# Patient Record
Sex: Female | Born: 1980 | Marital: Married | Smoking: Never smoker
Health system: Southern US, Community
[De-identification: ages and names within clinical notes are randomized; demographics above are authoritative.]

## PROBLEM LIST (undated history)

## (undated) DIAGNOSIS — F32A Depression, unspecified: Secondary | ICD-10-CM

## (undated) DIAGNOSIS — G4720 Circadian rhythm sleep disorder, unspecified type: Secondary | ICD-10-CM

## (undated) DIAGNOSIS — E079 Disorder of thyroid, unspecified: Secondary | ICD-10-CM

## (undated) DIAGNOSIS — F419 Anxiety disorder, unspecified: Secondary | ICD-10-CM

## (undated) DIAGNOSIS — E049 Nontoxic goiter, unspecified: Secondary | ICD-10-CM

## (undated) DIAGNOSIS — F329 Major depressive disorder, single episode, unspecified: Secondary | ICD-10-CM

## (undated) DIAGNOSIS — R87629 Unspecified abnormal cytological findings in specimens from vagina: Secondary | ICD-10-CM

## (undated) DIAGNOSIS — Z8639 Personal history of other endocrine, nutritional and metabolic disease: Secondary | ICD-10-CM

## (undated) DIAGNOSIS — N200 Calculus of kidney: Secondary | ICD-10-CM

## (undated) HISTORY — DX: Disorder of thyroid, unspecified: E07.9

## (undated) HISTORY — DX: Anxiety disorder, unspecified: F41.9

## (undated) HISTORY — DX: Nontoxic goiter, unspecified: E04.9

## (undated) HISTORY — DX: Depression, unspecified: F32.A

## (undated) HISTORY — PX: BREAST SURGERY: SHX581

## (undated) HISTORY — DX: Unspecified abnormal cytological findings in specimens from vagina: R87.629

## (undated) HISTORY — DX: Circadian rhythm sleep disorder, unspecified type: G47.20

## (undated) HISTORY — DX: Calculus of kidney: N20.0

## (undated) HISTORY — DX: Personal history of other endocrine, nutritional and metabolic disease: Z86.39

## (undated) HISTORY — DX: Major depressive disorder, single episode, unspecified: F32.9

---

## 2005-07-30 ENCOUNTER — Observation Stay: Payer: Self-pay

## 2005-08-03 ENCOUNTER — Inpatient Hospital Stay: Payer: Self-pay | Admitting: Obstetrics and Gynecology

## 2009-01-26 ENCOUNTER — Ambulatory Visit: Payer: Self-pay

## 2009-05-22 ENCOUNTER — Encounter: Payer: Self-pay | Admitting: Obstetrics and Gynecology

## 2009-11-09 ENCOUNTER — Inpatient Hospital Stay: Payer: Self-pay

## 2010-02-24 ENCOUNTER — Emergency Department: Payer: Self-pay | Admitting: Internal Medicine

## 2011-04-16 ENCOUNTER — Ambulatory Visit: Payer: Self-pay | Admitting: Physician Assistant

## 2011-04-18 ENCOUNTER — Ambulatory Visit: Payer: Self-pay | Admitting: Physician Assistant

## 2011-06-05 ENCOUNTER — Ambulatory Visit: Payer: Self-pay | Admitting: Urology

## 2011-08-20 HISTORY — PX: AUGMENTATION MAMMAPLASTY: SUR837

## 2013-04-08 LAB — HM PAP SMEAR: HM PAP: NEGATIVE

## 2013-06-15 ENCOUNTER — Encounter: Payer: Self-pay | Admitting: Podiatry

## 2013-06-15 ENCOUNTER — Ambulatory Visit (INDEPENDENT_AMBULATORY_CARE_PROVIDER_SITE_OTHER): Payer: BC Managed Care – PPO | Admitting: Podiatry

## 2013-06-15 ENCOUNTER — Ambulatory Visit (INDEPENDENT_AMBULATORY_CARE_PROVIDER_SITE_OTHER): Payer: BC Managed Care – PPO

## 2013-06-15 VITALS — BP 102/68 | HR 82 | Resp 16 | Ht 63.0 in | Wt 115.0 lb

## 2013-06-15 DIAGNOSIS — M2010 Hallux valgus (acquired), unspecified foot: Secondary | ICD-10-CM

## 2013-06-15 DIAGNOSIS — M79672 Pain in left foot: Secondary | ICD-10-CM

## 2013-06-15 DIAGNOSIS — M79609 Pain in unspecified limb: Secondary | ICD-10-CM

## 2013-06-15 DIAGNOSIS — Q66229 Congenital metatarsus adductus, unspecified foot: Secondary | ICD-10-CM

## 2013-06-15 NOTE — Patient Instructions (Signed)
Call if you decide to move your surgery up to earlier date

## 2013-06-15 NOTE — Progress Notes (Signed)
Subjective:     Patient ID: Kimberly Romero, female   DOB: 1980-11-20, 32 y.o.   MRN: [id number]  HPI patient states I have bunions for a long time in my left 1 has become increasingly tender over the last couple years and especially the last 6 months have a significant family history with my mother and and having had surgery in the past   Review of Systems  All other systems reviewed and are negative.       Objective:   Physical Exam  Nursing note and vitals reviewed. Constitutional: She is oriented to person, place, and time. She appears well-developed and well-nourished.  Musculoskeletal: Normal range of motion.  Neurological: She is oriented to person, place, and time.  Skin: Skin is warm.   neurological sensation intact muscle strength adequate and mild equinus condition noted bilateral. Large hyperostosis medial aspect first metatarsal head left over right with redness and pain when pressed. 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 I discussed different treatment options and patient has decided on surgery for the left foot I have recommended [facility] bunionectomy explaining that because of metatarsus adductus we may not get complete correction but I do think it will give her a good clinical result. Patient will be seen back for consult prior to surgery tentative surgery in January

## 2013-06-15 NOTE — Progress Notes (Signed)
N HURT/TENDER L B/L BUNIONS LEFT IS WORSE D 66M O SLOWLY C WORSE A SHOES T 0

## 2013-07-23 ENCOUNTER — Encounter: Payer: Self-pay | Admitting: Podiatry

## 2013-07-23 ENCOUNTER — Ambulatory Visit (INDEPENDENT_AMBULATORY_CARE_PROVIDER_SITE_OTHER): Payer: No Typology Code available for payment source | Admitting: Podiatry

## 2013-07-23 VITALS — BP 116/69 | HR 76 | Resp 16 | Ht 63.0 in | Wt 115.0 lb

## 2013-07-23 DIAGNOSIS — M2010 Hallux valgus (acquired), unspecified foot: Secondary | ICD-10-CM

## 2013-07-23 NOTE — Patient Instructions (Signed)
Follow preop instructions and do not eat midnight night before surgery

## 2013-07-24 NOTE — Progress Notes (Signed)
Subjective:     Patient ID: Kimberly Romero, female   DOB: [birth year], 32 y.o.   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 She wants surgery and I have recommended [place] bunionectomy explaining we may not be able to get full correction that I do think it will put it into a good functioning clinical position. I allowed her to read a consent form explaining all possible complications that can occur and the fact that total recovery will be 6 months to one year. Patient signs consent form and is dispense air fracture walker with all instructions on usage which she will bring with her to surgery. Scheduled December 30 for outpatient [facility]

## 2013-07-27 ENCOUNTER — Telehealth: Payer: Self-pay | Admitting: *Deleted

## 2013-07-27 NOTE — Telephone Encounter (Signed)
PT CALLED STATED SHE CALLED HER INS CO RE: TERMINATION ON 12.31.14. SAID SHE HAS GOTTEN EVERYTHING STRAIGHT AND INS WILL NOT BE TERMINATED.

## 2013-07-27 NOTE — Telephone Encounter (Signed)
CALLED AND SPOKE WITH PT LETTING HER KNOW [employer] WAS BEING TERMINATED ON 12.31.14. PT SAID SHE WOULD CALL HER INS CO TO FIND OUT WHY. TOLD PT HER SURGERY WOULD BE COVERED [facility] ON 12.30.14 BUT X-RAYS, ETC WILL NOT BE IF NO INS COVERAGE.

## 2013-08-17 DIAGNOSIS — M2010 Hallux valgus (acquired), unspecified foot: Secondary | ICD-10-CM

## 2013-08-18 ENCOUNTER — Telehealth: Payer: Self-pay | Admitting: *Deleted

## 2013-08-18 NOTE — Telephone Encounter (Signed)
Called and spoke with pt regarding surgery. Said she was doing fine and is taking 2 pain pills every 4hrs. Told her to elevate above heart and stay off of it and apply ice and take rx as directed.

## 2013-08-24 ENCOUNTER — Encounter: Payer: Self-pay | Admitting: Podiatry

## 2013-08-24 ENCOUNTER — Ambulatory Visit (INDEPENDENT_AMBULATORY_CARE_PROVIDER_SITE_OTHER): Payer: No Typology Code available for payment source

## 2013-08-24 ENCOUNTER — Ambulatory Visit (INDEPENDENT_AMBULATORY_CARE_PROVIDER_SITE_OTHER): Payer: No Typology Code available for payment source | Admitting: Podiatry

## 2013-08-24 VITALS — BP 120/72 | HR 72 | Resp 16

## 2013-08-24 DIAGNOSIS — Z9889 Other specified postprocedural states: Secondary | ICD-10-CM

## 2013-08-24 DIAGNOSIS — M2010 Hallux valgus (acquired), unspecified foot: Secondary | ICD-10-CM

## 2013-08-24 NOTE — Progress Notes (Signed)
   Subjective:    Patient ID: Kimberly LangoBrandy Kronbergs, female    DOB: November 04, 1980, 33 y.o.   MRN: [id number]  HPI Comments: Routine post op , surgical check #1  Left foot aus bun repair, pt states that it is doing good, has not put any pressure down on it without the boot.     Review of Systems     Objective:   Physical Exam        Assessment & Plan:

## 2013-08-24 NOTE — Progress Notes (Signed)
Subjective:     Patient ID: Kimberly Romero, female   DOB: 03-Mar-1981, 33 y.o.   MRN: [id number]  HPI patient states that she is doing very well post surgery of the left foot 6 days   Review of Systems     Objective:   Physical Exam Neurovascular status intact with incision site healing very well and excellent range of motion of the first MPJ with no crepitus in the joint. Mild edema with no erythema or drainage noted    Assessment:     Healing well post bunionectomy left    Plan:     X-ray reviewed and sterile dressing reapplied with instructions on range of motion exercises and gradual surgical shoe usage. Reappoint 3 weeks and less needed to be seen earlier

## 2013-09-08 ENCOUNTER — Encounter: Payer: Self-pay | Admitting: Podiatry

## 2013-09-14 ENCOUNTER — Encounter: Payer: BC Managed Care – PPO | Admitting: Podiatry

## 2013-09-15 ENCOUNTER — Ambulatory Visit (INDEPENDENT_AMBULATORY_CARE_PROVIDER_SITE_OTHER): Payer: No Typology Code available for payment source | Admitting: Podiatry

## 2013-09-15 ENCOUNTER — Encounter: Payer: Self-pay | Admitting: Podiatry

## 2013-09-15 ENCOUNTER — Ambulatory Visit (INDEPENDENT_AMBULATORY_CARE_PROVIDER_SITE_OTHER): Payer: No Typology Code available for payment source

## 2013-09-15 VITALS — BP 126/83 | HR 95 | Resp 16 | Ht 63.0 in | Wt 115.0 lb

## 2013-09-15 DIAGNOSIS — Z9889 Other specified postprocedural states: Secondary | ICD-10-CM

## 2013-09-15 NOTE — Progress Notes (Signed)
Kimberly Romero presents today one month status post [facility] bunion repair left. She states that her foot feels so much better than it did prior to surgery. She denies fever chills nausea vomiting muscle aches and pains.  Objective: Vital signs are stable she is alert and oriented x3. Pulses are strongly palpable. She has good range of motion of the first metatarsophalangeal joint. Radiographs confirm 2 K wires first metatarsal good alignment of the joint.  Assessment: Well-healing surgical foot status post 1 month.  Plan: I would allow her to get back to some of her regular routine. Dr. Charlsie Merlesegal will followup with her in one month

## 2013-10-15 ENCOUNTER — Encounter: Payer: No Typology Code available for payment source | Admitting: Podiatry

## 2013-10-19 ENCOUNTER — Ambulatory Visit (INDEPENDENT_AMBULATORY_CARE_PROVIDER_SITE_OTHER): Payer: No Typology Code available for payment source

## 2013-10-19 ENCOUNTER — Encounter: Payer: Self-pay | Admitting: Podiatry

## 2013-10-19 ENCOUNTER — Ambulatory Visit (INDEPENDENT_AMBULATORY_CARE_PROVIDER_SITE_OTHER): Payer: No Typology Code available for payment source | Admitting: Podiatry

## 2013-10-19 VITALS — BP 106/67 | HR 72 | Resp 12

## 2013-10-19 DIAGNOSIS — Z9889 Other specified postprocedural states: Secondary | ICD-10-CM

## 2013-10-19 DIAGNOSIS — M2010 Hallux valgus (acquired), unspecified foot: Secondary | ICD-10-CM

## 2013-10-19 DIAGNOSIS — R609 Edema, unspecified: Secondary | ICD-10-CM

## 2013-10-19 NOTE — Progress Notes (Signed)
Subjective:     Patient ID: Kimberly LangoBrandy Kronbergs, female   DOB: Apr 25, 1981, 33 y.o.   MRN: [id number]  HPI patient states that she is doing well with her left foot and is able to walk and work without much trouble with occasional swelling   Review of Systems     Objective:   Physical Exam Neurovascular status intact with well-healing surgical site first metatarsal left with good range of motion an approximate 30 of dorsi flexion 25 of plantar flexion with no pain or crepitus noted    Assessment:     Doing well 8 weeks post foot surgery left    Plan:     Instructed on continued physical therapy and compression and elevation as needed. Reappoint 8 weeks or earlier if any issues should occur and x-ray reviewed today

## 2013-12-21 ENCOUNTER — Encounter: Payer: No Typology Code available for payment source | Admitting: Podiatry

## 2015-02-17 ENCOUNTER — Telehealth: Payer: Self-pay | Admitting: Obstetrics and Gynecology

## 2015-02-17 NOTE — Telephone Encounter (Signed)
PT IS GOING O VACATIONS AND WANTED TO SEE IF YOU COULD CALL IN A REFILL FOR HER XANEX AND SLEEPING PILL.

## 2015-02-21 ENCOUNTER — Telehealth: Payer: Self-pay | Admitting: Obstetrics and Gynecology

## 2015-02-21 ENCOUNTER — Other Ambulatory Visit: Payer: Self-pay | Admitting: Obstetrics and Gynecology

## 2015-02-21 MED ORDER — ALPRAZOLAM 0.5 MG PO TABS: 0.5000 mg | ORAL_TABLET | Freq: Every evening | ORAL | Status: DC | PRN

## 2015-02-21 MED ORDER — ZOLPIDEM TARTRATE 5 MG PO TABS: 5.0000 mg | ORAL_TABLET | Freq: Every evening | ORAL | Status: DC | PRN

## 2015-02-21 NOTE — Telephone Encounter (Signed)
Pt caleld last weeka nd was wanting refills on her medication, she is going out of town

## 2015-02-21 NOTE — Telephone Encounter (Signed)
Left detailed message for pt notifying pt rx was printed and on [facility] desk, will have her sign and fax to pts pharmacy  In the am

## 2015-02-21 NOTE — Telephone Encounter (Signed)
Please let her know they were done today

## 2015-02-23 ENCOUNTER — Telehealth: Payer: Self-pay | Admitting: Obstetrics and Gynecology

## 2015-02-23 ENCOUNTER — Other Ambulatory Visit: Payer: Self-pay | Admitting: Obstetrics and Gynecology

## 2015-02-23 MED ORDER — TRIAZOLAM 0.25 MG PO TABS: 0.2500 mg | ORAL_TABLET | Freq: Every evening | ORAL | Status: DC | PRN

## 2015-02-23 NOTE — Telephone Encounter (Signed)
Please let her know printed rx

## 2015-02-23 NOTE — Telephone Encounter (Signed)
Kimberly Romero WAS CALLED IN AND SHE TAKE TRIAZOLAM GENERIC FOR HALCION. THANKS

## 2015-02-23 NOTE — Telephone Encounter (Signed)
Done will fax to pts pharmacy

## 2015-02-23 NOTE — Telephone Encounter (Signed)
PT AWARE  

## 2015-03-07 ENCOUNTER — Ambulatory Visit: Payer: Self-pay | Admitting: Obstetrics and Gynecology

## 2015-03-28 ENCOUNTER — Encounter: Payer: Self-pay | Admitting: *Deleted

## 2015-04-04 ENCOUNTER — Ambulatory Visit (INDEPENDENT_AMBULATORY_CARE_PROVIDER_SITE_OTHER): Payer: 59 | Admitting: Obstetrics and Gynecology

## 2015-04-04 ENCOUNTER — Encounter: Payer: Self-pay | Admitting: Obstetrics and Gynecology

## 2015-04-04 VITALS — BP 112/74 | HR 78 | Ht 63.0 in | Wt 117.5 lb

## 2015-04-04 DIAGNOSIS — Z30433 Encounter for removal and reinsertion of intrauterine contraceptive device: Secondary | ICD-10-CM

## 2015-04-04 NOTE — Progress Notes (Signed)
Patient ID: Kimberly Romero, female   DOB: 12/09/1980, 34 y.o.   MRN: [id number]   Kimberly Romero is a 34 y.o. year old G81P0 Caucasian female who presents for removal and replacement of a Mirena IUD. She was given informed consent for removal and reinsertion of her Mirena. Her Mirena was placed 2011, No LMP recorded. Patient is not currently having periods (Reason: IUD)., and her pregnancy test today was NA.   The risks and benefits of the method and placement have been thouroughly reviewed with the patient and all questions were answered.  Specifically the patient is aware of failure rate of 08/998, expulsion of the IUD and of possible perforation.  The patient is aware of irregular bleeding due to the method and understands the incidence of irregular bleeding diminishes with time.  Signed copy of informed consent in chart.   No LMP recorded. Patient is not currently having periods (Reason: IUD). BP 112/74 mmHg  Pulse 78  Ht  (1.6 m)  Wt 117 lb 8 oz (53.298 kg)  BMI 20.82 kg/m2 No results found for this or any previous visit (from the past 24 hour(s)).   Appropriate time out taken. A graves speculum was placed in the vagina.  The cervix was visualized, prepped using Betadine. The strings were visible. They were grasped and the Mirena was easily removed. The cervix was then grasped with a single-tooth tenaculum. The uterus was found to be anteroflexed and it sounded to 9 cm.  Mirena IUD placed per manufacturer's recommendations without complications. The strings were trimmed to 3 cm.  The patient tolerated the procedure well.   The patient was given post procedure instructions, including signs and symptoms of infection and to check for the strings after each menses or each month, and refraining from intercourse or anything in the vagina for 3 days.  She was given a Mirena care card with date Mirena placed, and date Mirena to be removed.    Melody Elissa Lovett, CNM

## 2015-04-18 ENCOUNTER — Encounter: Payer: Self-pay | Admitting: Obstetrics and Gynecology

## 2015-05-11 ENCOUNTER — Other Ambulatory Visit: Payer: Self-pay | Admitting: Obstetrics and Gynecology

## 2015-05-11 ENCOUNTER — Encounter: Payer: Self-pay | Admitting: Obstetrics and Gynecology

## 2015-05-11 ENCOUNTER — Ambulatory Visit (INDEPENDENT_AMBULATORY_CARE_PROVIDER_SITE_OTHER): Payer: 59 | Admitting: Obstetrics and Gynecology

## 2015-05-11 DIAGNOSIS — N921 Excessive and frequent menstruation with irregular cycle: Secondary | ICD-10-CM | POA: Diagnosis not present

## 2015-05-11 DIAGNOSIS — Z975 Presence of (intrauterine) contraceptive device: Secondary | ICD-10-CM

## 2015-05-11 DIAGNOSIS — F419 Anxiety disorder, unspecified: Secondary | ICD-10-CM

## 2015-05-11 DIAGNOSIS — Z01419 Encounter for gynecological examination (general) (routine) without abnormal findings: Secondary | ICD-10-CM | POA: Diagnosis not present

## 2015-05-11 MED ORDER — ALPRAZOLAM 0.5 MG PO TABS: 0.5000 mg | ORAL_TABLET | Freq: Every evening | ORAL | Status: DC | PRN

## 2015-05-11 MED ORDER — TRIAZOLAM 0.25 MG PO TABS: 0.2500 mg | ORAL_TABLET | Freq: Every evening | ORAL | Status: DC | PRN

## 2015-05-11 MED ORDER — DESVENLAFAXINE SUCCINATE ER 50 MG PO TB24: 50.0000 mg | ORAL_TABLET | Freq: Every day | ORAL | Status: DC

## 2015-05-11 NOTE — Patient Instructions (Addendum)
Place annual gynecologic exam patient instructions here.  Thank you for enrolling in MyChart. Please follow the instructions below to securely access your online medical record. MyChart allows you to send messages to your doctor, view your test results, manage appointments, and more.   How Do I Sign Up? 1. In your Internet browser, go to [employer] and enter [email or web address]. 2. Click on the Sign Up Now link in the Sign In box. You will see the New Member Sign Up page. 3. Enter your MyChart Access Code exactly as it appears below. You will not need to use this code after you've completed the sign-up process. If you do not sign up before the expiration date, you must request a new code.  MyChart Access Code: [phone] Expires: 07/10/2015 10:31 AM  4. Enter your Social Security Number ([id number]) and Date of Birth (mm/dd/yyyy) as indicated and click Submit. You will be taken to the next sign-up page. 5. Create a MyChart ID. This will be your MyChart login ID and cannot be changed, so think of one that is secure and easy to remember. 6. Create a MyChart password. You can change your password at any time. 7. Enter your Password Reset Question and Answer. This can be used at a later time if you forget your password.  8. Enter your e-mail address. You will receive e-mail notification when new information is available in MyChart. 9. Click Sign Up. You can now view your medical record.   Additional Information Remember, MyChart is NOT to be used for urgent needs. For medical emergencies, dial 911.   Generalized Anxiety Disorder Generalized anxiety disorder (GAD) is a mental disorder. It interferes with life functions, including relationships, work, and school. GAD is different from normal anxiety, which everyone experiences at some point in their lives in response to specific life events and activities. Normal anxiety actually helps [place] prepare for and get through these  life events and activities. Normal anxiety goes away after the event or activity is over.  GAD causes anxiety that is not necessarily related to specific events or activities. It also causes excess anxiety in proportion to specific events or activities. The anxiety associated with GAD is also difficult to control. GAD can vary from mild to severe. People with severe GAD can have intense waves of anxiety with physical symptoms (panic attacks).  SYMPTOMS The anxiety and worry associated with GAD are difficult to control. This anxiety and worry are related to many life events and activities and also occur more days than not for 6 months or longer. People with GAD also have three or more of the following symptoms (one or more in children):  Restlessness.   Fatigue.  Difficulty concentrating.   Irritability.  Muscle tension.  Difficulty sleeping or unsatisfying sleep. DIAGNOSIS GAD is diagnosed through an assessment by your health care provider. Your health care provider will ask you questions aboutyour mood,physical symptoms, and events in your life. Your health care provider may ask you about your medical history and use of alcohol or drugs, including prescription medicines. Your health care provider may also do a physical exam and blood tests. Certain medical conditions and the use of certain substances can cause symptoms similar to those associated with GAD. Your health care provider may refer you to a mental health specialist for further evaluation. TREATMENT The following therapies are usually used to treat GAD:   Medication. Antidepressant medication usually is prescribed for long-term daily control. Antianxiety medicines may be added in severe cases,  especially when panic attacks occur.   Talk therapy (psychotherapy). Certain types of talk therapy can be helpful in treating GAD by providing support, education, and guidance. A form of talk therapy called cognitive behavioral therapy can  teach you healthy ways to think about and react to daily life events and activities.  Stress managementtechniques. These include yoga, meditation, and exercise and can be very helpful when they are practiced regularly. A mental health specialist can help determine which treatment is best for you. Some people see improvement with one therapy. However, other people require a combination of therapies. Document Released: 11/30/2012 Document Revised: 12/20/2013 Document Reviewed: 11/30/2012 [facility] Patient Information 2015 [place], [place]. This information is not intended to replace advice given to you by your health care provider. Make sure you discuss any questions you have with your health care provider.

## 2015-05-11 NOTE — Progress Notes (Signed)
  Subjective:     Kimberly Romero is a 34 y.o. female and is here for a comprehensive physical exam. The patient reports no problems.  Social History   Social History  . Marital Status: Married    Spouse Name: N/A  . Number of Children: N/A  . Years of Education: N/A   Occupational History  . Not on file.   Social History Main Topics  . Smoking status: Never Smoker   . Smokeless tobacco: Never Used  . Alcohol Use: Yes     Comment: OCCASIONALLY  . Drug Use: No  . Sexual Activity: Yes   Other Topics Concern  . Not on file   Social History Narrative   Health Maintenance  Topic Date Due  . HIV Screening  02/28/1994  . TETANUS/TDAP  02/28/1998  . INFLUENZA VACCINE  03/20/2015  . PAP SMEAR  04/08/2016    The following portions of the patient's history were reviewed and updated as appropriate: allergies, current medications, past family history, past medical history, past social history, past surgical history and problem list.  Review of Systems A comprehensive review of systems was negative.   Objective:    General appearance: alert, cooperative and appears stated age Neck: no adenopathy, no carotid bruit, no JVD, supple, symmetrical, trachea midline and thyroid not enlarged, symmetric, no tenderness/mass/nodules Lungs: clear to auscultation bilaterally Breasts: normal appearance, no masses or tenderness, bilateral implants without defect Heart: regular rate and rhythm, S1, S2 normal, no murmur, click, rub or gallop Abdomen: soft, non-tender; bowel sounds normal; no masses,  no organomegaly Pelvic: cervix normal in appearance, external genitalia normal, no adnexal masses or tenderness, no cervical motion tenderness, rectovaginal septum normal, uterus normal size, shape, and consistency, vagina normal without discharge and IUD string noted, dark brown scant discharge    Assessment:    Healthy female exam. Thyroid disorder; IUD survellience, anxiety with sleep  disturbance      Plan:  Pap and routine labs; wants to restart Pristiq at 50 mg, and continue current meds; RTC 1 year or as needed   See After Visit Summary for Counseling Recommendations

## 2015-05-12 LAB — COMPREHENSIVE METABOLIC PANEL WITH GFR
ALT: 11 IU/L (ref 0–32)
AST: 14 IU/L (ref 0–40)
Albumin/Globulin Ratio: 2 (ref 1.1–2.5)
Albumin: 4.9 g/dL (ref 3.5–5.5)
Alkaline Phosphatase: 41 IU/L (ref 39–117)
BUN/Creatinine Ratio: 10 (ref 8–20)
BUN: 11 mg/dL (ref 6–20)
Bilirubin Total: 0.5 mg/dL (ref 0.0–1.2)
CO2: 25 mmol/L (ref 18–29)
Calcium: 9.5 mg/dL (ref 8.7–10.2)
Chloride: 98 mmol/L (ref 97–108)
Creatinine, Ser: 1.05 mg/dL — ABNORMAL HIGH (ref 0.57–1.00)
GFR calc Af Amer: 79 mL/min/1.73
GFR calc non Af Amer: 68 mL/min/1.73
Globulin, Total: 2.5 g/dL (ref 1.5–4.5)
Glucose: 76 mg/dL (ref 65–99)
Potassium: 4 mmol/L (ref 3.5–5.2)
Sodium: 139 mmol/L (ref 134–144)
Total Protein: 7.4 g/dL (ref 6.0–8.5)

## 2015-05-12 LAB — THYROID PANEL WITH TSH
Free Thyroxine Index: 3.2 (ref 1.2–4.9)
T3 Uptake Ratio: 30 % (ref 24–39)
T4 TOTAL: 10.7 ug/dL (ref 4.5–12.0)
TSH: 0.627 u[IU]/mL (ref 0.450–4.500)

## 2015-05-12 LAB — LIPID PANEL
Chol/HDL Ratio: 2.1 ratio (ref 0.0–4.4)
Cholesterol, Total: 183 mg/dL (ref 100–199)
HDL: 86 mg/dL
LDL Calculated: 83 mg/dL (ref 0–99)
Triglycerides: 69 mg/dL (ref 0–149)
VLDL Cholesterol Cal: 14 mg/dL (ref 5–40)

## 2015-05-15 LAB — CYTOLOGY - PAP

## 2015-05-16 ENCOUNTER — Encounter: Payer: Self-pay | Admitting: *Deleted

## 2015-08-02 ENCOUNTER — Other Ambulatory Visit: Payer: Self-pay | Admitting: Obstetrics and Gynecology

## 2015-08-02 ENCOUNTER — Telehealth: Payer: Self-pay | Admitting: Obstetrics and Gynecology

## 2015-08-02 MED ORDER — ALPRAZOLAM 0.5 MG PO TABS: 0.5000 mg | ORAL_TABLET | Freq: Every evening | ORAL | Status: DC | PRN

## 2015-08-02 NOTE — Telephone Encounter (Signed)
Patient called requesting a refill on Xanax. Patient is aware that she will need to pick it up.Thanks

## 2015-08-02 NOTE — Telephone Encounter (Signed)
Notified pt rx was faxed

## 2015-08-02 NOTE — Telephone Encounter (Signed)
Refill done, have her set it up.

## 2015-08-02 NOTE — Telephone Encounter (Signed)
pls advise

## 2015-08-30 ENCOUNTER — Telehealth: Payer: Self-pay | Admitting: Obstetrics and Gynecology

## 2015-08-30 NOTE — Telephone Encounter (Signed)
Ins related about medication and authorization

## 2015-08-31 ENCOUNTER — Other Ambulatory Visit: Payer: Self-pay | Admitting: Obstetrics and Gynecology

## 2015-08-31 MED ORDER — FLUOXETINE HCL 20 MG PO CAPS: 20.0000 mg | ORAL_CAPSULE | Freq: Every day | ORAL | Status: DC

## 2015-08-31 NOTE — Telephone Encounter (Signed)
i have been trying to get prior auth all week for this pts pristiq 50mg , she is totally out  Would like to just try something different, something similar to this if possible, she has tried busprione before and didn't really like that

## 2015-08-31 NOTE — Telephone Encounter (Signed)
Please let her know I sent in a prescription for Prozac, to let me know after she has been taking it for 3 weeks how she is feeling.

## 2015-09-01 NOTE — Telephone Encounter (Signed)
Done-ac 

## 2015-10-17 ENCOUNTER — Other Ambulatory Visit: Payer: Self-pay | Admitting: Obstetrics and Gynecology

## 2015-10-17 ENCOUNTER — Telehealth: Payer: Self-pay | Admitting: Obstetrics and Gynecology

## 2015-10-17 MED ORDER — TRIAZOLAM 0.25 MG PO TABS: 0.2500 mg | ORAL_TABLET | Freq: Every evening | ORAL | Status: DC | PRN

## 2015-10-17 MED ORDER — ALPRAZOLAM 0.5 MG PO TABS: 0.5000 mg | ORAL_TABLET | Freq: Two times a day (BID) | ORAL | Status: DC | PRN

## 2015-10-17 NOTE — Telephone Encounter (Signed)
Patient called requesting a refill on xanax and triazolam. Thanks

## 2015-10-17 NOTE — Telephone Encounter (Signed)
pls advise

## 2015-10-17 NOTE — Telephone Encounter (Signed)
Done-ac 

## 2015-10-17 NOTE — Telephone Encounter (Signed)
Please let her know I refilled both

## 2015-12-28 ENCOUNTER — Telehealth: Payer: Self-pay | Admitting: Obstetrics and Gynecology

## 2015-12-28 NOTE — Telephone Encounter (Signed)
PT NEEDS PROZAC REFILLED PLEASE

## 2015-12-28 NOTE — Telephone Encounter (Signed)
Done-ac 

## 2016-01-16 ENCOUNTER — Other Ambulatory Visit: Payer: Self-pay | Admitting: Obstetrics and Gynecology

## 2016-01-16 ENCOUNTER — Telehealth: Payer: Self-pay | Admitting: Obstetrics and Gynecology

## 2016-01-16 MED ORDER — ALPRAZOLAM 0.5 MG PO TABS: 0.5000 mg | ORAL_TABLET | Freq: Two times a day (BID) | ORAL | Status: DC | PRN

## 2016-01-16 NOTE — Telephone Encounter (Signed)
Mel I dont see

## 2016-01-16 NOTE — Telephone Encounter (Signed)
pls advise

## 2016-01-16 NOTE — Telephone Encounter (Signed)
I printed a refill, please let me know if you don't see it over at office

## 2016-01-16 NOTE — Telephone Encounter (Signed)
Patient called requesting a refill on xanax.Thanks °

## 2016-01-17 ENCOUNTER — Other Ambulatory Visit: Payer: Self-pay | Admitting: Obstetrics and Gynecology

## 2016-01-17 MED ORDER — ALPRAZOLAM 0.5 MG PO TABS: 0.5000 mg | ORAL_TABLET | Freq: Two times a day (BID) | ORAL | Status: DC | PRN

## 2016-02-05 ENCOUNTER — Telehealth: Payer: Self-pay | Admitting: Obstetrics and Gynecology

## 2016-02-05 NOTE — Telephone Encounter (Signed)
This pt needs her controlled substance refilled. The pharmacy said it is a day early bur the pt is going out of town early Wednesday morning and the refill wouldn't be but a day early to refill.

## 2016-02-06 ENCOUNTER — Other Ambulatory Visit: Payer: Self-pay | Admitting: Obstetrics and Gynecology

## 2016-02-06 MED ORDER — TRIAZOLAM 0.25 MG PO TABS: 0.2500 mg | ORAL_TABLET | Freq: Every evening | ORAL | Status: DC | PRN

## 2016-02-06 NOTE — Telephone Encounter (Signed)
Please let her know it is ready for pick up 

## 2016-02-22 ENCOUNTER — Telehealth: Payer: Self-pay | Admitting: Obstetrics and Gynecology

## 2016-02-22 ENCOUNTER — Other Ambulatory Visit: Payer: Self-pay | Admitting: Obstetrics and Gynecology

## 2016-02-22 NOTE — Telephone Encounter (Signed)
Please let her know I just refilled it last month with 3 refills????

## 2016-02-22 NOTE — Telephone Encounter (Signed)
pls advise

## 2016-02-22 NOTE — Telephone Encounter (Signed)
Patient left a message requesting a refill on her sleep meds.

## 2016-02-23 NOTE — Telephone Encounter (Signed)
Pt called again, but was made aware of the number of refills and advised to have the pharmacy call us if there were any issues.

## 2016-02-23 NOTE — Telephone Encounter (Signed)
Done-ac 

## 2016-05-14 ENCOUNTER — Encounter: Payer: Self-pay | Admitting: Obstetrics and Gynecology

## 2016-05-14 ENCOUNTER — Encounter: Payer: 59 | Admitting: Obstetrics and Gynecology

## 2016-05-23 ENCOUNTER — Encounter: Payer: Self-pay | Admitting: Obstetrics and Gynecology

## 2016-05-23 ENCOUNTER — Ambulatory Visit (INDEPENDENT_AMBULATORY_CARE_PROVIDER_SITE_OTHER): Payer: BLUE CROSS/BLUE SHIELD | Admitting: Obstetrics and Gynecology

## 2016-05-23 VITALS — BP 113/58 | HR 81 | Ht 63.0 in | Wt 120.5 lb

## 2016-05-23 DIAGNOSIS — Z87898 Personal history of other specified conditions: Secondary | ICD-10-CM

## 2016-05-23 DIAGNOSIS — Z01419 Encounter for gynecological examination (general) (routine) without abnormal findings: Secondary | ICD-10-CM

## 2016-05-23 MED ORDER — HYDROCODONE-ACETAMINOPHEN 5-325 MG PO TABS: 1.0000 | ORAL_TABLET | Freq: Four times a day (QID) | ORAL | 0 refills | Status: DC | PRN

## 2016-05-23 NOTE — Progress Notes (Signed)
Subjective:   Kimberly Romero is a 35 y.o. 862P0 Caucasian female here for a routine well-woman exam.  No LMP recorded. Patient is not currently having periods (Reason: IUD).    Current complaints: intermittent flank pain c/w previously noted renal stones, getting more frequent PCP: me       does desire labs  Social History: Sexual: heterosexual Marital Status: married Living situation: with family Occupation: unknown occupation Tobacco/alcohol: no tobacco use Illicit drugs: no history of illicit drug use  The following portions of the patient's history were reviewed and updated as appropriate: allergies, current medications, past family history, past medical history, past social history, past surgical history and problem list.  Past Medical History Past Medical History:  Diagnosis Date  . Anxiety   . Depression   . Disturbed sleep rhythm   . Enlarged thyroid   . History of hypothyroidism   . Vaginal Pap smear, abnormal    years ago    Past Surgical History Past Surgical History:  Procedure Laterality Date  . BREAST SURGERY    . CESAREAN SECTION     X 2    Gynecologic History G2P0  No LMP recorded. Patient is not currently having periods (Reason: IUD). Contraception: IUD Last Pap: 2016. Results were: normal   Obstetric History OB History  Gravida Para Term Preterm AB Living  2         2  SAB TAB Ectopic Multiple Live Births          2    # Outcome Date GA Lbr Len/2nd Weight Sex Delivery Anes PTL Lv  2 Gravida 2011    M CS-Unspec   LIV  1 Gravida 2006    M CS-Unspec   LIV      Current Medications Current Outpatient Prescriptions on File Prior to Visit  Medication Sig Dispense Refill  . ALPRAZolam (XANAX) 0.5 MG tablet Take 1 tablet (0.5 mg total) by mouth 2 (two) times daily as needed for anxiety. 60 tablet 2  . FLUoxetine (PROZAC) 20 MG capsule Take 1 capsule (20 mg total) by mouth daily. 30 capsule 3  . ibuprofen (ADVIL,MOTRIN) 100 MG tablet Take 100 mg  by mouth as needed for fever.    Marland Kitchen. levonorgestrel (MIRENA) 20 MCG/24HR IUD 1 each by Intrauterine route once.    . triazolam (HALCION) 0.25 MG tablet Take 1 tablet (0.25 mg total) by mouth at bedtime as needed for sleep. 30 tablet 3   No current facility-administered medications on file prior to visit.     Review of Systems Patient denies any headaches, blurred vision, shortness of breath, chest pain, abdominal pain, problems with bowel movements, urination, or intercourse.  Objective:  BP (!) 113/58   Pulse 81   Ht 5\' 3"  (1.6 m)   Wt 120 lb 8 oz (54.7 kg)   BMI 21.35 kg/m  Physical Exam  General:  Well developed, well nourished, no acute distress. She is alert and oriented x3. Skin:  Warm and dry Neck:  Midline trachea, no thyromegaly or nodules Cardiovascular: Regular rate and rhythm, no murmur heard Lungs:  Effort normal, all lung fields clear to auscultation bilaterally Breasts:  No dominant palpable mass, retraction, or nipple discharge Abdomen:  Soft, non tender, no hepatosplenomegaly or masses Pelvic:  External genitalia is normal in appearance.  The vagina is normal in appearance. The cervix is bulbous, no CMT, IUD string noted.  Thin prep pap is not done . Uterus is felt to be normal size, shape,  and contour.  No adnexal masses or tenderness noted. Extremities:  No swelling or varicosities noted Psych:  She has a normal mood and affect  Assessment:   Healthy well-woman exam Intermittent flank pain IUD check  Plan:  Labs obtained and renal u/s ordered- will refer back to urology if indicated. F/U 1 year for AE, or sooner if needed   Melody Suzan Nailer, CNM

## 2016-05-23 NOTE — Patient Instructions (Addendum)
Cuidados preventivos en las mujeres adultas (Preventive Care for Adults, Female) Un estilo de vida saludable y los cuidados preventivos pueden favorecer la salud y Sena. Las pautas de salud preventivas para las mujeres incluyen las siguientes prcticas clave:  Un examen fsico de rutina anual y Optometrist estudios preventivos es un buen modo de Chief Technology Officer su salud. [place] de Publishing rights manager preocupaciones y Civil engineer, contracting el estado de su salud, y que le realicen estudios completos.  Consulte al dentista para realizar un examen de rutina y cuidados preventivos cada 6 meses. Cepllese los dientes al [employer] veces por da y psese el hilo dental al menos una vez por da. Una buena higiene bucal evita caries y enfermedades de las encas.  La frecuencia con que debe hacerse exmenes de la vista depende de su edad, su estado de [place], su historia familiar, el uso de lentes de contacto y otros factores. Siga las recomendaciones del mdico para saber con qu frecuencia debe hacerse exmenes de la vista.  Consuma una dieta saludable. Los alimentos que contienen vegetales, las frutas, los cereales [place], los productos lcteos bajos en grasas y las protenas magras contienen nutrientes que son necesarios, sin consumir Nurse, mental health. Disminuya la ingesta de alimentos ricos en grasas slidas, azcares y sal agregadas. Consuma la cantidad de caloras adecuada para usted. Si es necesario, pdale informacin acerca de una dieta [place] a su mdico.  Realizar actividad fsica de forma regular es una de las prcticas ms importantes que puede hacer por su salud. Los adultos deben hacer al menos 150 minutos de ejercicios de intensidad moderada (cualquier actividad que aumente la frecuencia cardaca y lo haga transpirar) cada semana. Adems, la [employer] de los adultos necesita practicar ejercicios de fortalecimiento muscular dos o ms veces por semana.  Mantenga un peso saludable. El ndice de masa  corporal [facility]) es una herramienta que identifica posibles problemas con [place]. Proporciona una estimacin de la grasa corporal basndose en el peso y la altura. El mdico podr determinar su [facility] y ayudarlo a Scientist, forensic o Theatre manager un peso saludable. Para los adultos de 20 aos o ms:  Un [facility] menor de 18,5 se considera bajo peso.  Un [facility] entre 18,5 y 24,9 es normal.  Un [facility] entre 25 y 29,9 se considera sobrepeso.  Un IMC de 30 o ms se considera obesidad.  Realice actividad fsica y evite ingerir grasas saturadas para mantener un nivel normal de lpidos y Research scientist (life sciences). Consuma una dieta equilibrada y saludable, e incluya variedad de frutas y Photographer. A partir de los 20 aos se deben realizar anlisis de sangre a fin de Freight forwarder nivel de lpidos y colesterol en la sangre y [place] cada 5 aos. Si los niveles de lpidos o colesterol son altos, tiene ms de 50 aos o tiene riesgo elevado de sufrir enfermedades cardacas, Designer, industrial/product controlarse con ms frecuencia. Si tiene Coca Cola de lpidos y colesterol, debe recibir tratamiento con medicamentos, si la dieta y el ejercicio no estn funcionando.  Si fuma, consulte con el mdico acerca de las opciones para dejar de [place]. Si no consume tabaco, no comience.  Se recomienda realizar exmenes de deteccin de cncer de pulmn a personas adultas entre 78 y 68 aos que estn en riesgo de Horticulturist, commercial de pulmn por sus antecedentes de consumo de tabaco. Para quienes hayan fumado durante 30 aos un paquete diario, y sigan fumando o hayan dejado el hbito en algn momento en los ltimos 15 aos, se recomienda  realizarse una tomografa computarizada de baja dosis de los [employer]. Fumar 1paquete-ao equivale a fumar un promedio de 1paquete de cigarrillos diario durante 1ao (por ejemplo: 1paquete por da durante 30 aos o 2paquetes por da durante 15aos). Los exmenes anuales deben continuar hasta que el fumador  haya dejado de fumar durante un mnimo de 15 aos. Ya no deben Emergency planning/management officer que tengan un problema de salud que les impida recibir tratamiento para el cncer de pulmn.  Si est embarazada, no beba alcohol. Si est amamantando, beba alcohol con prudencia. Si no est embarazada y decide beber alcohol, no beba ms de Naval architect. Se considera una medida a 12onzas (368m) de cerveza, 5onzas (1419m de vino, o 1,[id number] licor.  Evite el consumo de drogas. No comparta las agujas. Pida ayuda si necesita asistencia o instrucciones con respecto a abandonar el consumo de drogas.  La hipertensin arterial causa enfermedades cardacas y auSerbial riesgo de ictus. Debe controlar su presin arterial al menos cada uno o doYaleLa hipertensin arterial que persiste debe tratarse con medicamentos si la prdida de peso y el ejercicio no son efectivos.  Si tiene entre [id number], consulte a su mdico si debe tomar aspirina para prevenir ictus.  Los anC.H. Robinson Worldwidee deteccin de la diabetes se realizan extrayendo una muFort Pierce Northe saGreen Seaara verificar el nivel sanguneo de glucosa despus de no haber comido durante determinado perodo (ayFredonia Si usted no tiene sobrepeso ni factores de riesgo de diabetes, deben hacerle estos anlisis una vez cada 3 aos a partir de los 4534os. Si usted tiene sobrepeso u obesidad y su edad es de 40 a 7066os, deben hacerle anlisis de deProgramme researcher, broadcasting/film/videoe la diabetes todos los aos como parte de la evaluacin del riesgo cardiovascular.  Las evaluaciones para dePublic affairs consultante mama son un mtodo preventivo fundamental para las mujeres. Debe practicar la "autoconciencia de las mamas". Esto significa que deChief Technology Officerpariencia normal de sus mamas y cmo se sienten, y haElectrical engineern autoexamen de maGlass blower/designerSi detecta algn cambio, no importa cun pequeo sea, debe informarlo a su mdico. Las muConAgra Foods0 y 3063os deben hacerse un examen clnico de las mamas como  parte del examen regular de saMagnoliacada uno a tres aos. Despus de los [address]deben haCoca-ColaA partir de los [id number]deben hacerse una mamografa (radiografa de mamas) cada ao. Las mujeres con antecedentes familiares de cncer de mama deben hablar con el mdico para someterse a un estudio gentico. Las que tienen ms riesgo deben hacerse una resonancia magntica y unLavinia Sharpsodos loUnadilla La evaluacin del riesgo de cncer relacionado con el gen del cncer de mama (BRCA) se recomienda a mujeres que tengan familiares con cncer relacionado con el BRCA. Los cnceres relacionados con el BRCA incluyen el cncer de mama, de ovario, de trompas y peritoneal. TeRaynelle Janamiliares con estos tumores malignos puede estar asociado con un mayor riesgo de cambios dainos (mutaciones) en los genes del cncer de mama BRCA1 y BRCA2. Los resultados de la evaluacin determinarn la necesidad de asesoramiento gentico y de anKingsleye BRCA1 y BRCA2.  El mdico puede recomendarle que se haga pruebas peridicas de deteccin de cncer de los rganos de la pelvis (ovarios, tero y vagina). Estas pruebas incluyen un examen plvico, que abarca controlar si se produjeron cambios microscpicos en la superficie del cuello del tero (prueba de Papanicolaou). Pueden recomendarle  que se haga estas pruebas cada 3aos, a partir de los 21aos.  A las mujeres que tienen entre 30 y 71aos, los mdicos pueden recomendarles que se sometan a exmenes plvicos y pruebas de Papanicolaou cada 98aos, o a la prueba de Papanicolaou y el examen plvico en combinacin con estudios de deteccin del virus del papiloma humano (VPH) cada 5aos. Algunos tipos de VPH aumentan el riesgo de Chief Financial Officer de cuello del tero. La prueba para la deteccin del VPH tambin puede realizarse a mujeres de cualquier edad cuyos resultados de la prueba de Papanicolaou no sean claros.  Es posible que otros mdicos no recomienden exmenes de  deteccin a mujeres no embarazadas que se consideran sujetos de bajo riesgo de Chief Financial Officer de pelvis y que no tienen sntomas. Pregntele al mdico si un examen plvico de deteccin es adecuado para usted.  Si ha recibido un tratamiento para Science writer cervical o una enfermedad que podra causar cncer, necesitar realizarse una prueba de Papanicolaou y controles durante al menos 92 aos de concluido el [place]. Si no se ha hecho el Papanicolaou con regularidad, debern volver a evaluarse los factores de riesgo (como tener un nuevo compaero sexual), para Teacher, adult education si debe realizarse los estudios nuevamente. Algunas mujeres sufren problemas mdicos que aumentan la probabilidad de Museum/gallery curator cncer de cuello del tero. En estos casos, el mdico podr [employer] se realicen controles y pruebas de Papanicolaou con ms frecuencia.  El cncer colorrectal puede detectarse y con frecuencia puede prevenirse. La mayor parte de los estudios de rutina se deben Medical laboratory scientific officer a Field seismologist a Proofreader de los 45 aos y Twin 20 aos. Sin embargo, el mdico podr aconsejarle que lo haga antes, si tiene factores de riesgo para el cncer de colon. Una vez por ao, el mdico le dar un kit de prueba para Hydrologist en la materia fecal. La utilizacin de un tubo con una pequea cmara en su extremo para examinar directamente el colon (sigmoidoscopia o colonoscopia), puede detectar formas tempranas de cncer colorrectal. Hable con su mdico si tiene 64 aos, edad en la que debe comenzar a Optometrist los estudios de [place]. El examen directo del colon debe repetirse cada 5 a 10aos, hasta los 35aos, excepto que se encuentren formas tempranas de plipos precancerosos o pequeos bultos.  Las personas con un riesgo mayor de Insurance risk surveyor hepatitis B deben realizarse anlisis para Futures trader virus. Se considera que tiene un alto riesgo de Museum/gallery curator hepatitis B si:  Naci en un pas donde la hepatitis B es frecuente. Pregntele a su  mdico qu pases son considerados de Public affairs consultant.  Sus padres nacieron en un pas de alto riesgo y usted no recibi una vacuna que lo proteja contra la hepatitisB (vacuna contra la hepatitisB).  [place].  [place] agujas para inyectarse drogas.  Vive o tiene sexo con alguien que tiene hepatitis B.  Recibe tratamiento de hemodilisis.  Toma ciertos medicamentos para Nurse, mental health, trasplante de rganos y afecciones autoinmunes.  Se recomienda realizar un anlisis de sangre para Hydrographic surveyor hepatitis C a todas las personas nacidas entre 1945 y 1965, y a todo aquel que sepa que tiene riesgo de haber contrado esta enfermedad.  Practique el sexo seguro. Use condones y evite las prcticas sexuales riesgosas para disminuir el contagio de enfermedades de transmisin sexual (ETS). Algunas ETS son la gonorrea, clamidia, sfilis, tricomoniasis, herpes, VPH y el virus de inmunodeficiencia humana (VIH). El herpes, el VIH y Farmer VPH son enfermedades virales que  no tienen cura. Pueden producir discapacidad, cncer y [employer].  Debe realizarse pruebas de deteccin de enfermedades de transmisin sexual (ETS), incluidas gonorrea y clamidia si:  Es sexualmente activa y es menor de 24aos.  Es mayor de 24aos, y Investment banker, operational informa que corre riesgo de tener este tipo de [place].  La actividad sexual ha cambiado desde que le hicieron la ltima prueba de deteccin y tiene un riesgo mayor de Best boy clamidia o Radio broadcast assistant. Pregntele al mdico si usted tiene riesgo.  Si tiene riesgo de infectarse por el VIH, se recomienda tomar diariamente un medicamento recetado para evitar la infeccin. Esto se conoce como profilaxis previa a la exposicin. Se considera que est en riesgo si:  Es [place] sexualmente y no [place] preservativos habitualmente o no conoce el estado del VIH de sus Advertising copywriter.  Se inyecta drogas.  Es [place] sexualmente con Ardelia Mems pareja que tiene VIH.  Consulte a su  mdico para saber si tiene un alto riesgo de infectarse por el VIH. Si opta por comenzar la profilaxis previa a la exposicin, primero debe realizarse anlisis de deteccin del VIH. Luego, le harn anlisis cada 72mses mientras est tomando los medicamentos para la profilaxis previa a la exposicin.  La osteoporosis es una enfermedad en la que los huesos pierden los minerales y la fuerza por el avance de la edad. El resultado pueden ser fracturas o quebraduras graves en lSantee El riesgo de osteoporosis puede identificarse con uArdelia Memsprueba de densidad sea. Las mujeres de ms de 686aos y las que tengan riesgos de sufrir fracturas u osteoporosis deben discutir las opciones de control con su mdico. Consulte a su mdico si debe tomar un suplemento de calcio o de vitamina D para reducir el riesgo de osteoporosis.  La menopausia est asociada a sntomas y riesgos fsicos. Se dispone de una terapia de reemplazo hormonal para disminuir los sntomas y lMabscott Consulte a su mdico para saber si la terapia de reemplazo hormonal es conveniente para usted.  Utilice pantalla solar. Aplique pantalla solar de mKerry Doryy repetida a lo largo del dTraining and development officer Resgurdese del sol cuando la sombra sea ms pequea que usted. Protjase usando mangas y pantalones largos, un sombrero de ala ancha y anteojos para el sol todo el ao, siempre que se encuentre al aHaines[place]  Una vez por mes hgase un examen de la piel de todo el cuerpo usando un espejo para ver la espalda. Informe al mdico si aparecen nuevos lunares, o si nota que los que ya tena ahora tienen bordes iBristol aumentaron su tamao y son ms grandes que una goma de lpiz o si su forma o color cambi.  Mantngase al da con las vacunas obligatorias.  Vacuna antigripal. Todas las personas adultas deben vacunarse cada ao.  Vacuna contra la difteria, el ttanos y lResearch officer, trade union(Td, Tdap). Las mujeres embarazadas deben recibir una dosis de la vacuna  Tdap en cada embarazo. Se debe recibir la dosis independientemente de cunto tiempo haya transcurrido desde la ltima dosis. Es preferible vacunarse entre la semana 215y 374de la gestacin. Una persona adulta que no haya recibido la vacuna Tdap anteriormente o que no sabe su estado de vacunacin debe recibir una dosis. Despus de esta dosis inicial, necesitar aplicarse un refuerzo de la vacuna contra la difteria y el ttanos (Td) cada 10 aos. Las pEstée Lauderno sepan o no hayan recibido la serie de vacunacin de tres dosis contra la difteria y  el ttanos deben iniciar o finalizar una serie de vacunacin primaria, que incluye la dosis contra la difteria, el ttanos y Research officer, trade union (Tdap). Las personas adultas deben recibir una dosis de refuerzo de Td cada 10 aos.  Vacuna contra la varicela. Ardelia Mems persona adulta sin prueba de inmunidad a la varicela debe recibir dos dosis o una segunda dosis si recibi una dosis previamente. Las embarazadas sin prueba de inmunidad deben recibir la primera dosis despus del Media planner. Esta primera dosis se debe aplicar antes del alta del centro de salud. La segunda dosis debe aplicarse entre 4 y 8 semanas posteriores a la primera dosis.  Vacuna contra el virus del Engineer, technical sales (VPH). Las ConAgra Foods 13 y 36 aos que no hayan recibido la vacuna antes deben recibir la serie de 3 dosis. La vacuna no se recomienda en mujeres embarazadas. Sin embargo, no es Chartered loss adjuster una prueba de [place] antes de recibir una dosis. Si se descubre que una mujer est embarazada despus de recibir la dosis, no se Producer, television/film/video. En ese caso, las dosis restantes deben retrasarse hasta despus del embarazo. Se recomienda la vacuna para cualquier persona inmunodeprimida hasta la edad de 26 aos si no recibi [place] o ninguna de las dosis anteriormente. Durante la serie de 3 dosis, la segunda dosis debe Enterprise Products 4 y 8 semanas posteriores a la primera dosis. La  tercera dosis debe aplicarse 24 semanas despus de la primera dosis y 16 semanas despus de la segunda dosis.  Vacuna contra el herpes zoster. Se recomienda una dosis en personas [employer] de 15 aos a menos que sufran ciertas enfermedades.  Vacuna contra el sarampin, la rubola y las paperas ([place]). Los adultos nacidos antes de 1957 generalmente se consideran inmunes al sarampin y las paperas. Las Forensic scientist en [phone] o posteriormente deben recibir una o ms dosis de la vacuna SRP, a menos que [employer] contraindicacin para la vacuna o que tengan prueba de inmunidad a las tres enfermedades. Se debe aplicar una segunda dosis de rutina de la vacuna SRP al menos 28das despus de la primera dosis a estudiantes de escuelas terciarias, trabajadores de la salud o viajeros internacionales. Las personas que recibieron la vacuna inactivada contra el sarampin o algn tipo desconocido de vacuna contra el sarampin [place] y 1967 deben recibir dos dosis de la vacuna [place]. Las Advertising copywriter recibieron la vacuna inactivada contra las paperas o algn tipo desconocido de vacuna contra las paperas antes de 1979 y tienen un alto riesgo de infectarse con la enfermedad deben considerar vacunarse con dos dosis de la vacuna SRP. En las mujeres en edad frtil, debe determinarse la inmunidad contra la rubola. Si no hay prueba de inmunidad, las mujeres que no estn embarazadas deben vacunarse. Si no hay prueba de inmunidad, las mujeres que estn embarazadas deben retrasar la vacunacin hasta despus del [place]. Los trabajadores de [employer]	[place] no vacunados que nacieron antes de 1957 y que no tienen prueba de inmunidad contra el sarampin, la rubola y las paperas o no tienen confirmacin de laboratorio de la enfermedad deben considerar vacunarse contra el sarampin y las paperas con dos dosis de la vacuna [place], y contra la rubola con una dosis de la vacuna SRP.  Vacuna antineumoccica conjugada 13 valente  (PCV13). Segn indicacin mdica, una persona que no conozca su historia de vacunacin y no tenga registro de vacunas debe recibir la vacuna PCV13. Todos los adultos de 65 aos o ms deben recibir  esta vacuna. Una persona de 19 aos o ms que tenga ciertas enfermedades y no se haya vacunado debe recibir una dosis de la vacuna PCV13. Despus de esta vacuna, se debe aplicar una dosis de la vacuna antineumoccica de polisacridos (PPSV23). Los adultos con alto riesgo de enfermedad neumoccica deben recibir la vacuna PPSV23 al menos 8 semanas despus de la dosis de la vacuna PCV13. Los adultos de ms de 65 aos cuyo sistema inmunitario funcione normalmente deben recibir la dosis de la vacuna PPSV23 al menos 1 ao despus de la dosis de la vacuna PCV13.  Vacuna antineumoccica de polisacridos (PPSV23). Si se indica la vacuna PCV13, primero debe recibir la vacuna PCV13. Todas las personas de 65 aos o mayores deben recibir la vacuna. Una Network engineer de 21 aos que tenga ciertas enfermedades se Teacher, English as a foreign language. Cleora Fleet persona que viva en un hogar de Mining engineer o en un centro de atencin durante mucho tiempo se debe vacunar. Un fumador adulto se Teacher, English as a foreign language. Las personas inmunodeprimidas o con otras enfermedades deben recibir ambas vacunas, PCV13 y PPSV23. Las personas infectadas con el virus de la inmunodeficiencia humana (VIH) deben recibir la vacuna lo antes posible despus del diagnstico. Se debe evitar la vacunacin durante tratamientos de quimioterapia y radioterapia. El uso de rutina de la vacuna PPSV23 no est recomendado para Teacher, early years/pre, personas nativas de [place] o [employer] de 65aos, salvo que tengan ciertas enfermedades que requieran la vacuna. Segn indicacin mdica, las personas que no conozcan su historia de vacunacin y no tengan registros de [place], deben recibir la vacuna PPSV23. Se recomienda una nica revacunacin 5 aos despus de recibir la primera dosis de PPSV23 para personas de 19 a 68 aos  con insuficiencia renal crnica, sndrome nefrtico, asplenia o inmunodepresin. Las [place] [employer] recibieron de una a dos dosis de PPSV23 antes de los 65 aos deben recibir otra dosis de [place] a los 65 aos de edad o posteriormente si pasaron cinco aos, como mnimo, desde la dosis anterior. Las dosis de PPSV23 no son necesarias para personas que ya recibieron la vacuna a los 79 aos o posteriormente.  Vacuna antimeningoccica. Los adultos con asplenia o con persistentes deficiencias de componentes terminales del complemento deben recibir dos dosis de la vacuna antimeningoccica conjugada tetravalente (MenACWY-D). Las dosis se deben Midwife con un mnimo de 2 meses de diferencia. Deben vacunarse los microbilogos que trabajan con ciertas bacterias meningoccicas, reclutas militares y personas que viajan o viven en pases con una alta tasa de meningitis. Los estudiantes universitarios de Tourist information centre manager la edad de 21 que vivan en una residencia estudiantil deben recibir una dosis si no se aplicaron la vacuna cuando cumplieron o despus de cumplir 16 aos. Las personas que sufren ciertas enfermedades de alto riesgo deben aplicarse una o ms dosis.  Vacuna contra la hepatitis A. Las Advertising copywriter deseen estar protegidas contra esta enfermedad, que sufren ciertas enfermedades de alto riesgo, que trabajan con animales infectados con hepatitis A, que trabajan en los laboratorios de investigacin de hepatitis A, o que viajan o trabajan en pases con una alta tasa de hepatitis A deben recibir la vacuna. Los personas que no fueron vacunadas previamente y Deno Etienne a tener un contacto cercano con una persona adoptada fuera del pas, deben recibir la vacuna durante los primeros [address] despus de su llegada a los [place] desde un pas con una alta tasa de hepatitis A.  Vacuna contra la hepatitis B. Las [place] [employer] deseen estar protegidas contra esta enfermedad,  que sufren ciertas enfermedades de alto riesgo,  que puedan estar expuestas a sangre u otros fluidos corporales infecciosos, que tienen contactos familiares o parejas sexuales con hepatitis B positivo, que sean clientes o trabajadores de ciertos centros de atencin, o que viajan o trabajan en pases con una alta tasa de hepatitis B deben recibir la vacuna.  Vacuna antihaemophilus influenzae tipoB (Hib). Una persona no vacunada previamente, que sufra de asplenia o de anemia drepanoctica, o que tenga una esplenectoma programada debe recibir una dosis de la vacuna Hib. Independientemente de la vacunacin previa, un paciente trasplantado con clulas madre hematopoyticas debe recibir Ardelia Mems serie de tres dosis, de 6 a 12 meses despus del trasplante exitoso. La vacuna [place] no est recomendada para personas adultas infectadas con VIH. Controles preventivos - Frecuencia Entre 48 y 59aos  Control de la presin arterial.**/Cada 3 a 5 aos.  Control de lpidos y colesterol.**Carma Lair cinco aos a partir de los 20 aos.  Examen clnico de [employer] Johnson.**Carma Lair 3 aos en las Principal Financial 20 y los 25 aos.  Evaluacin del riesgo de cncer relacionado con el BRCA.**/Para mujeres que tienen familiares con cncer relacionado con el BRCA (cncer de mama, de ovario, de trompas y peritoneal).  Prueba de Papanicolaou.**Carma Lair dos [employer]isource[employer] Corporation 21 y los 37 aos. Cada tres aos a Proofreader de los 9 aos y [place] 65 o 24 aos, con una historia de tres pruebas de Papanicolaou normales consecutivas.  Pruebas de deteccin de VPH.**/Cada tres aos, a partir de los 24 aos y Franquez 32 o 67 aos, con una historia de tres pruebas de Papanicolaou normales consecutivas.  Anlisis de sangre para la hepatitis C.**/Para toda persona con riesgos conocidos de hepatitis C.  Autoexamen de piel [employer].  Vacuna antigripal. [place] los aos.  Vacuna contra la difteria, el ttanos y Research officer, trade union (Tdap, Td).**/Consulte a su mdico. Las mujeres embarazadas deben  recibir una dosis de la vacuna Tdap en cada embarazo. 1dosis de Td cada 10aos.  Vacuna contra la varicela.**/Consulte a su mdico. Las embarazadas sin prueba de inmunidad deben recibir la primera dosis despus del Media planner.  Vacuna contra el VPH. /3 dosis en el curso de 6 meses, si tiene 78 aos o menos. La vacuna no se recomienda en mujeres embarazadas. Sin embargo, no es Chartered loss adjuster una prueba de [place] antes de recibir una dosis.  Vacuna contra el sarampin, la rubola y las paperas ([place]).Marland KitchenEarleen Newport aplicarse al menos una dosis de SRP si ha nacido en 1957 o despus. Podra tambin necesitar una segunda dosis. En las mujeres en edad frtil, debe determinarse la inmunidad contra la rubola. Si no hay prueba de inmunidad, las mujeres que no estn embarazadas deben vacunarse. Si no hay prueba de inmunidad, las mujeres que estn embarazadas deben retrasar la vacunacin hasta despus del [place].  Vacuna antineumoccica conjugada 13 valente (PCV13).Marland KitchenCecille Amsterdam a su mdico.  Vacuna antineumoccica de polisacridos (PPSV23).**/De una a dos dosis si es fumador o si sufre Actuary.  Vacuna antimeningoccica.**/Si tiene entre 80 y 7 aos y es estudiante universitario de Editor, commissioning que vive en una residencia estudiantil o tiene alguna enfermedad grave, debe recibir Ardelia Mems dosis de esta vacuna. Podra tambin necesitar dosis de refuerzo.  Vacuna contra la hepatitis A.**/Consulte a su mdico.  Vacuna contra la hepatitis B.**/Consulte a su mdico.  Vacuna antihaemophilus influenzae tipoB (Hib).**/Consulte a su mdico. Entre 40 y 31aos  Control de la presin Runner, broadcasting/film/video.  Control de lpidos y colesterol.Marland KitchenCarma Lair cinco  aos a [employer] 88 aos.  Pruebas de deteccin de cncer de pulmn. /Todos los aos si tiene entre 38 y 80aos, y si ha fumado durante 30aos un paquete diario y sigue fumando o dej el hbito en algn momento en los ltimos 15aos. Los estudios  de Pharmacologist se interrumpen cuando haya dejado de fumar durante al menos 15aos o si tiene un problema de salud que le impida recibir tratamiento para Science writer de pulmn.  Examen clnico de [employer].**/Todos los aos despus de los 40 aos.  Evaluacin del riesgo de cncer relacionado con el BRCA.**/Para mujeres que tienen familiares con cncer relacionado con el BRCA (cncer de mama, de ovario, de trompas y peritoneal).  Mamografa.**/Una vez por ao a partir de los 11 aos, siempre que tenga buena salud. Consulte a su mdico.  Prueba de Papanicolaou.Marland KitchenCarma Lair tres aos despus de los 17 aos y [place] 4 o 26 aos, con una historia de tres pruebas de Papanicolaou normales consecutivas.  Pruebas de deteccin de VPH.**/Cada tres aos, a partir de los 71 aos y [place] 61 o 59 aos, con una historia de tres pruebas de Papanicolaou normales consecutivas.  Prueba de Personnel officer en las heces [facility]). Carma Lair ao a partir de los 15aos y [employer] 2aos. No tendr que hacerlo si se realiza una colonoscopia cada 10 aos.  Colonoscopia o sigmoidoscopia flexible.Marland KitchenCarma Lair 5 aos para la sigmoidoscopia flexible o cada 10 aos para la colonoscopia, a Proofreader de los 48 aos de edad y [place] 8 aos.  Anlisis de sangre para la hepatitis C.**/Para todas las personas nacidas entre 1945 y 1965, y a todo aquel que tenga un riesgo conocido de haber contrado esta enfermedad.  Autoexamen de piel [employer].  Vacuna antigripal. [place] los aos.  Vacuna contra la difteria, el ttanos y Research officer, trade union (Tdap, Td).**/Consulte a su mdico. Las mujeres embarazadas deben recibir una dosis de la vacuna Tdap en cada embarazo. 1dosis de Td cada 10aos.  Vacuna contra la varicela.**/Consulte a su mdico. Las embarazadas sin prueba de inmunidad deben recibir la primera dosis despus del Media planner.  Vacuna contra el herpes zoster.Marland KitchenArdelia Mems dosis para adultos de 60 aos o ms.  Vacuna contra el  sarampin, la rubola y las paperas ([place]).Marland KitchenEarleen Newport aplicarse al menos una dosis de SRP si ha nacido en 1957 o despus. Podra tambin necesitar una segunda dosis. En las mujeres en edad frtil, debe determinarse la inmunidad contra la rubola. Si no hay prueba de inmunidad, las mujeres que no estn embarazadas deben vacunarse. Si no hay prueba de inmunidad, las mujeres que estn embarazadas deben retrasar la vacunacin hasta despus del [place].  Vacuna antineumoccica conjugada 13 valente (PCV13).Marland KitchenCecille Amsterdam a su mdico.  Vacuna antineumoccica de polisacridos (PPSV23).**/De una a dos dosis si es fumador o si sufre Actuary.  Vacuna antimeningoccica.Marland KitchenCecille Amsterdam a su mdico.  Investment banker, operational contra la hepatitis A.**/Consulte a su mdico.  Vacuna contra la hepatitis B.**/Consulte a su mdico.  Vacuna antihaemophilus influenzae tipoB (Hib).**/Consulte a su mdico. Ms de 42 aos  Control de la presin Runner, broadcasting/film/video.  Control de lpidos y colesterol.**Carma Lair cinco aos a partir de los 20 aos.  Pruebas de deteccin de cncer de pulmn. /Todos los aos si tiene entre 28 y 80aos, y si ha fumado durante 30aos un paquete diario y sigue fumando o dej el hbito en algn momento en los ltimos 15aos. Los estudios de Pharmacologist se interrumpen cuando haya dejado de fumar durante al menos 15aos  o si tiene un problema de salud que le impida recibir tratamiento para Science writer de pulmn.  Examen clnico de [employer].**/Todos los aos despus de los 40 aos.  Evaluacin del riesgo de cncer relacionado con el BRCA.**/Para mujeres que tienen familiares con cncer relacionado con el BRCA (cncer de mama, de ovario, de trompas y peritoneal).  Mamografa.**/Una vez por ao a partir de los 34 aos, siempre que tenga buena salud. Consulte a su mdico.  Prueba de Papanicolaou.**Carma Lair tres aos despus de los 20 aos y [place] 65 o 26 aos, con tres pruebas de Papanicolaou  normales consecutivas. Las pruebas pueden interrumpirse [employer] 1 y los 43 aos, si tiene tres pruebas de Papanicolaou normales consecutivas y ninguna prueba de Papanicolaou ni de VPH anormal en los ltimos 10 aos.  Pruebas de deteccin de VPH.**/Cada tres aos, a partir de los 47 aos y [place] 3 o 6 aos, con una historia de tres pruebas de Papanicolaou normales consecutivas. Las pruebas pueden interrumpirse [employer] 31 y los 54 aos, si tiene tres pruebas de Papanicolaou normales consecutivas y ninguna prueba de Papanicolaou ni de VPH anormal en los ltimos 10 aos.  Prueba de Personnel officer en las heces [facility]). Carma Lair ao a partir de los 66aos y [facility] 22aos. No tendr que hacerlo si se realiza una colonoscopia cada 10 aos.  Colonoscopia o sigmoidoscopia flexible.Marland KitchenCarma Lair 5 aos para la sigmoidoscopia flexible o cada 10 aos para la colonoscopia, a Proofreader de los 61 aos de edad y [place] 24 aos.  Anlisis de sangre para la hepatitis C.**/Para todas las personas nacidas entre 1945 y 1965, y a todo aquel que tenga un riesgo conocido de haber contrado esta enfermedad.  Pruebas de deteccin de osteoporosis.Marland KitchenWesley Blas nica vez en mujeres de ms de 13 aos que tengan riesgo de fracturas u osteoporosis.  Autoexamen de piel [employer].  Vacuna antigripal. [place] los aos.  Vacuna contra la difteria, el ttanos y Research officer, trade union (Tdap/Td).**/Una dosis de Td cada 10 aos.  Vacuna contra la varicela.**Cecille Amsterdam a su mdico.  Vacuna contra el herpes zoster.Marland KitchenArdelia Mems dosis para adultos de 60 aos o ms.  Vacuna antineumoccica conjugada 13 valente (PCV13).Marland KitchenCecille Amsterdam a su mdico.  Vacuna antineumoccica de polisacridos (PPSV23).Marland KitchenArdelia Mems dosis para todos los adultos de 65 aos o ms.  Vacuna antimeningoccica.Marland KitchenCecille Amsterdam a su mdico.  Investment banker, operational contra la hepatitis A.**/Consulte a su mdico.  Vacuna contra la hepatitis B.**/Consulte a su mdico.  Vacuna antihaemophilus  influenzae tipoB (Hib).**/Consulte a su mdico. ** Los antecedentes familiares y personales de [place] y enfermedades pueden Quarry manager las recomendaciones del mdico.   Esta informacin no tiene Marine scientist el consejo del mdico. Asegrese de hacerle al mdico cualquier pregunta que tenga.   Document Released: 05/15/2005 Document Revised: 08/26/2014 Elsevier Interactive Patient Education 2016 [place] you for enrolling in [place]. Please follow the instructions below to securely access your online medical record. MyChart allows you to send messages to your doctor, view your test results, renew your prescriptions, schedule appointments, and more.  How Do I Sign Up? 1. In your Internet browser, go to [email or web address]. 2. Click on the New  User? link in the Sign In box.  3. Enter your MyChart Access Code exactly as it appears below. You will not need to use this code after you have completed the sign-up process. If you do not sign up before the expiration date, you must request a new code. MyChart Access Code: [email or web address]  Expires: 07/22/2016 11:54 AM  4. Enter the last four digits of your Social Security Number (xxxx) and Date of Birth (mm/dd/yyyy) as indicated and click Next. You will be taken to the next sign-up page. 5. Create a MyChart ID. This will be your MyChart login ID and cannot be changed, so think of one that is secure and easy to remember. 6. Create a MyChart password. You can change your password at any time. 7. Enter your Password Reset Question and Answer and click Next. This can be used at a later time if you forget your password.  8. Select your communication preference, and if applicable enter your e-mail address. You will receive e-mail notification when new information is available in MyChart by choosing to receive e-mail notifications and filling in your e-mail. 9. Click Sign In. You can now view your medical record.    Additional Information If you have questions, you can email [email or web address] or call [phone] to talk to our [place] staff. Remember, MyChart is NOT to be used for urgent needs. For medical emergencies, dial 911.

## 2016-05-24 LAB — COMPREHENSIVE METABOLIC PANEL
ALK PHOS: 53 IU/L (ref 39–117)
ALT: 13 IU/L (ref 0–32)
AST: 16 IU/L (ref 0–40)
Albumin/Globulin Ratio: 1.7 (ref 1.2–2.2)
Albumin: 4.7 g/dL (ref 3.5–5.5)
BUN/Creatinine Ratio: 12 (ref 9–23)
BUN: 13 mg/dL (ref 6–20)
Bilirubin Total: 0.4 mg/dL (ref 0.0–1.2)
CHLORIDE: 98 mmol/L (ref 96–106)
CO2: 27 mmol/L (ref 18–29)
Calcium: 9.2 mg/dL (ref 8.7–10.2)
Creatinine, Ser: 1.12 mg/dL — ABNORMAL HIGH (ref 0.57–1.00)
GFR calc non Af Amer: 63 mL/min/{1.73_m2} (ref >59)
GFR, EST AFRICAN AMERICAN: 73 mL/min/{1.73_m2} (ref >59)
GLUCOSE: 46 mg/dL — AB (ref 65–99)
Globulin, Total: 2.7 g/dL (ref 1.5–4.5)
Potassium: 3.8 mmol/L (ref 3.5–5.2)
Sodium: 142 mmol/L (ref 134–144)
TOTAL PROTEIN: 7.4 g/dL (ref 6.0–8.5)

## 2016-05-24 LAB — LIPID PANEL
CHOL/HDL RATIO: 2.3 ratio (ref 0.0–4.4)
CHOLESTEROL TOTAL: 184 mg/dL (ref 100–199)
HDL: 81 mg/dL (ref >39)
LDL CALC: 84 mg/dL (ref 0–99)
Triglycerides: 93 mg/dL (ref 0–149)
VLDL Cholesterol Cal: 19 mg/dL (ref 5–40)

## 2016-05-24 LAB — THYROID PANEL WITH TSH
FREE THYROXINE INDEX: 2.1 (ref 1.2–4.9)
T3 UPTAKE RATIO: 26 % (ref 24–39)
T4, Total: 7.9 ug/dL (ref 4.5–12.0)
TSH: 0.901 u[IU]/mL (ref 0.450–4.500)

## 2016-05-29 ENCOUNTER — Ambulatory Visit
Admission: RE | Admit: 2016-05-29 | Discharge: 2016-05-29 | Disposition: A | Discharge status: Home / Self Care | Payer: BLUE CROSS/BLUE SHIELD | Source: Ambulatory Visit | Attending: Obstetrics and Gynecology | Admitting: Obstetrics and Gynecology

## 2016-05-29 DIAGNOSIS — N29 Other disorders of kidney and ureter in diseases classified elsewhere: Secondary | ICD-10-CM | POA: Insufficient documentation

## 2016-05-29 DIAGNOSIS — Z87442 Personal history of urinary calculi: Secondary | ICD-10-CM | POA: Diagnosis not present

## 2016-05-29 DIAGNOSIS — R109 Unspecified abdominal pain: Secondary | ICD-10-CM | POA: Diagnosis not present

## 2016-05-29 DIAGNOSIS — Z87898 Personal history of other specified conditions: Secondary | ICD-10-CM

## 2016-05-30 ENCOUNTER — Telehealth: Payer: Self-pay | Admitting: Obstetrics and Gynecology

## 2016-05-30 NOTE — Telephone Encounter (Signed)
pls advise

## 2016-05-30 NOTE — Telephone Encounter (Signed)
Patient called requesting ultrasound results from yesterday. Thanks

## 2016-06-03 ENCOUNTER — Other Ambulatory Visit: Payer: Self-pay | Admitting: *Deleted

## 2016-06-03 DIAGNOSIS — N200 Calculus of kidney: Secondary | ICD-10-CM

## 2016-06-10 ENCOUNTER — Other Ambulatory Visit: Payer: Self-pay

## 2016-06-10 DIAGNOSIS — N200 Calculus of kidney: Secondary | ICD-10-CM

## 2016-06-14 ENCOUNTER — Ambulatory Visit: Payer: BLUE CROSS/BLUE SHIELD | Admitting: Urology

## 2016-06-24 ENCOUNTER — Other Ambulatory Visit: Payer: Self-pay | Admitting: Obstetrics and Gynecology

## 2016-06-24 NOTE — Telephone Encounter (Signed)
See refill request.

## 2016-07-25 ENCOUNTER — Telehealth: Payer: Self-pay | Admitting: Obstetrics and Gynecology

## 2016-07-25 NOTE — Telephone Encounter (Signed)
pls advise

## 2016-07-25 NOTE — Telephone Encounter (Signed)
Patient is requesting a refill on hydrocodone just to get her by until her Urology appointment in January. Thanks

## 2016-07-26 ENCOUNTER — Other Ambulatory Visit: Payer: Self-pay | Admitting: Obstetrics and Gynecology

## 2016-07-26 MED ORDER — HYDROCODONE-ACETAMINOPHEN 5-325 MG PO TABS: 1.0000 | ORAL_TABLET | Freq: Four times a day (QID) | ORAL | 0 refills | Status: DC | PRN

## 2016-07-26 NOTE — Telephone Encounter (Signed)
Notified pt rx ready she will pick up 07/29/16

## 2016-07-26 NOTE — Telephone Encounter (Signed)
1 refill given.

## 2016-08-05 ENCOUNTER — Telehealth: Payer: Self-pay | Admitting: Obstetrics and Gynecology

## 2016-08-05 NOTE — Telephone Encounter (Signed)
pls see refill

## 2016-08-05 NOTE — Telephone Encounter (Signed)
Patient called requesting a refill on xanax.Thanks °

## 2016-08-07 ENCOUNTER — Other Ambulatory Visit: Payer: Self-pay | Admitting: Obstetrics and Gynecology

## 2016-08-07 MED ORDER — ALPRAZOLAM 0.5 MG PO TABS: 0.5000 mg | ORAL_TABLET | Freq: Two times a day (BID) | ORAL | 2 refills | Status: DC | PRN

## 2016-09-06 ENCOUNTER — Other Ambulatory Visit
Admission: RE | Admit: 2016-09-06 | Discharge: 2016-09-06 | Disposition: A | Discharge status: Home / Self Care | Payer: BLUE CROSS/BLUE SHIELD | Source: Ambulatory Visit | Attending: Urology | Admitting: Urology

## 2016-09-06 ENCOUNTER — Encounter: Payer: Self-pay | Admitting: Urology

## 2016-09-06 ENCOUNTER — Ambulatory Visit (INDEPENDENT_AMBULATORY_CARE_PROVIDER_SITE_OTHER): Payer: BLUE CROSS/BLUE SHIELD | Admitting: Urology

## 2016-09-06 VITALS — BP 133/69 | HR 90 | Ht 63.0 in | Wt 126.0 lb

## 2016-09-06 DIAGNOSIS — R3129 Other microscopic hematuria: Secondary | ICD-10-CM | POA: Diagnosis not present

## 2016-09-06 DIAGNOSIS — N200 Calculus of kidney: Secondary | ICD-10-CM | POA: Diagnosis present

## 2016-09-06 DIAGNOSIS — N29 Other disorders of kidney and ureter in diseases classified elsewhere: Secondary | ICD-10-CM | POA: Diagnosis not present

## 2016-09-06 DIAGNOSIS — R102 Pelvic and perineal pain: Secondary | ICD-10-CM

## 2016-09-06 LAB — URINALYSIS, COMPLETE (UACMP) WITH MICROSCOPIC
Bilirubin Urine: NEGATIVE
Glucose, UA: NEGATIVE mg/dL
Ketones, ur: NEGATIVE mg/dL
Leukocytes, UA: NEGATIVE
Nitrite: NEGATIVE
PROTEIN: NEGATIVE mg/dL
Specific Gravity, Urine: 1.015 (ref 1.005–1.030)
pH: 6 (ref 5.0–8.0)

## 2016-09-06 LAB — HCG, QUANTITATIVE, PREGNANCY: hCG, Beta Chain, Quant, S: <1 m[IU]/mL (ref <5)

## 2016-09-06 LAB — BUN: BUN: 10 mg/dL (ref 6–20)

## 2016-09-06 LAB — CREATININE, SERUM: Creatinine, Ser: 0.96 mg/dL (ref 0.44–1.00)

## 2016-09-06 NOTE — Progress Notes (Signed)
09/06/2016 12:04 PM   Kimberly Romero [birth year] [id number]  Referring provider: Purcell Nails, CNM [address] [place], [place] [address]  Chief Complaint  Patient presents with  . Nephrolithiasis    New Patient    HPI: Patient is a 36 year old Caucasian female who is referred for nephrolithiasis by Purcell Nails, CNM.    Patient was last seen by Melody in 05/2016.  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  Separate renal calculi are not well seen but might be obscured against the background of these echogenic pyramids.  I have independently reviewed the films.  Patient states that she has been having increasing intensity and frequency of bilateral suprapubic pain. She denies any gross hematuria. She denies any recent passage of stone fragments.  She states that when she experiences the pain it is quite intense and affecting her ability to work. 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  She states that she is not having urinary frequency, urgency, dysuria or incontinence. She is not having fevers, chills, nausea or vomiting.  She is not having pain in her flank areas at this time.  Her UA today was positive for 6-30 RBCs, 0-5 WBCs and a few bacteria.   Her most recent creatinine was 1.12 which is up from 1.05 one year ago.  Her stone composition is unknown. She has not undergone a metabolic analysis.  She states that she drinks large amounts of water during the day.  She avoids sodas and juices.    PMH: Past Medical History:  Diagnosis Date  . Anxiety   . Depression   . Disturbed sleep rhythm   . Enlarged thyroid   . History of hypothyroidism   . Vaginal Pap smear, abnormal    years ago    Surgical History: Past Surgical History:  Procedure Laterality Date  . BREAST SURGERY    . CESAREAN SECTION     X 2     Home Medications:  Allergies as of 09/06/2016      Reactions   Sulfa Antibiotics Other (See Comments)   UNKNOWN      Medication List       Accurate as of 09/06/16 12:04 PM. Always use your most recent med list.          ALPRAZolam 0.5 MG tablet Commonly known as:  XANAX Take 1 tablet (0.5 mg total) by mouth 2 (two) times daily as needed for anxiety.   FLUoxetine 20 MG capsule Commonly known as:  PROZAC Take 1 capsule (20 mg total) by mouth daily.   HYDROcodone-acetaminophen 5-325 MG tablet Commonly known as:  NORCO/VICODIN Take 1 tablet by mouth every 6 (six) hours as needed.   ibuprofen 100 MG tablet Commonly known as:  ADVIL,MOTRIN Take 100 mg by mouth as needed for fever.   levonorgestrel 20 MCG/24HR IUD Commonly known as:  MIRENA 1 each by Intrauterine route once.   triazolam 0.25 MG tablet Commonly known as:  HALCION take 1 tablet by mouth at bedtime if needed for sleep       Allergies:  Allergies  Allergen Reactions  . Sulfa Antibiotics Other (See Comments)    UNKNOWN    Family History: Family History  Problem Relation Age of Onset  . Cancer Maternal Grandfather     colon  . Prostate cancer Neg  Hx   . Kidney cancer Neg Hx     Social History:  reports that she has never smoked. She has never used smokeless tobacco. She reports that she drinks alcohol. She reports that she does not use drugs.  ROS: UROLOGY Frequent Urination?: No Hard to postpone urination?: No Burning/pain with urination?: No Get up at night to urinate?: No Leakage of urine?: No Urine stream starts and stops?: No Trouble starting stream?: No Do you have to strain to urinate?: No Blood in urine?: No Urinary tract infection?: Yes Sexually transmitted disease?: No Injury to kidneys or bladder?: No Painful intercourse?: No Weak stream?: No Currently pregnant?: No Vaginal bleeding?: No Last menstrual period?: n  Gastrointestinal Nausea?: No Vomiting?:  No Indigestion/heartburn?: No Diarrhea?: No Constipation?: No  Constitutional Fever: No Night sweats?: No Weight loss?: No Fatigue?: No  Skin Skin rash/lesions?: No Itching?: No  Eyes Blurred vision?: No Double vision?: No  Ears/Nose/Throat Sore throat?: No Sinus problems?: No  Hematologic/Lymphatic Swollen glands?: No Easy bruising?: No  Cardiovascular Leg swelling?: No Chest pain?: No  Respiratory Cough?: No Shortness of breath?: No  Endocrine Excessive thirst?: No  Musculoskeletal Back pain?: No Joint pain?: No  Neurological Headaches?: No Dizziness?: No  Psychologic Depression?: No Anxiety?: No  Physical Exam: BP 133/69   Pulse 90   Ht 5\' 3"  (1.6 m)   Wt 126 lb (57.2 kg)   BMI 22.32 kg/m   Constitutional: Well nourished. Alert and oriented, No acute distress. HEENT: [place] AT, moist mucus membranes. Trachea midline, no masses. Cardiovascular: No clubbing, cyanosis, or edema. Respiratory: Normal respiratory effort, no increased work of breathing. GI: Abdomen is soft, non tender, non distended, no abdominal masses. Liver and spleen not palpable.  No hernias appreciated.  Stool sample for occult testing is not indicated.   GU: No CVA tenderness.  No bladder fullness or masses.   Skin: No rashes, bruises or suspicious lesions. Lymph: No cervical or inguinal adenopathy. Neurologic: Grossly intact, no focal deficits, moving all 4 extremities. Psychiatric: Normal mood and affect.  Laboratory Data: Lab Results  Component Value Date   CREATININE 1.12 (H) 05/23/2016    Lab Results  Component Value Date   TSH 0.901 05/23/2016       Component Value Date/Time   CHOL 184 05/23/2016 1155   HDL 81 05/23/2016 1155   CHOLHDL 2.3 05/23/2016 1155   LDLCALC 84 05/23/2016 1155    Lab Results  Component Value Date   AST 16 05/23/2016   Lab Results  Component Value Date   ALT 13 05/23/2016    Urinalysis 6-30 RBC's.  0-5 WBC's.  Few bacteria.   See EPIC.    Pertinent Imaging: CLINICAL DATA:  Flank pain, history of renal calculi.  EXAM: RENAL / URINARY TRACT ULTRASOUND COMPLETE  COMPARISON:  04/18/2011  FINDINGS: Right Kidney:  Length: 11.0 cm. 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 No hydronephrosis or separate urinary tract calculus.  Left Kidney:  Length: 10.1 cm. 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 No hydronephrosis or separate urinary tract calculus identified.  Bladder:  Appears normal for degree of bladder distention.  IMPRESSION: 1. 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 Separate renal calculi are not well seen but might be obscured against the background of these echogenic pyramids.   Electronically Signed   By: Annitta NeedsWalter  Liebkemann M.D.  On: 05/29/2016 15:35   Assessment & Plan:    1. 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 Microscopic hematuria  - Urinalysis, Complete w Microscopic; Future  - Urine culture; Future  - most likely due to stones  - We will continue to monitor the patient's UA after the treatment/passage of the stone to ensure the hematuria has resolved.  If hematuria persists, we will  pursue a hematuria workup with CT Urogram and cystoscopy if appropriate.  3. Suprapubic pain  - not likely due to stones at this time   - will be undergoing a CT scan for further evaluation for ureteral stones  - UA sent for culture   Return for CT report.  These notes generated with voice recognition software. I apologize for typographical errors.  Michiel Cowboy, PA-C  [facility] [address] [place], [place] [address] [phone]

## 2016-09-07 LAB — URINE CULTURE

## 2016-09-11 ENCOUNTER — Telehealth: Payer: Self-pay | Admitting: *Deleted

## 2016-09-11 NOTE — Telephone Encounter (Signed)
Spoke with pt in reference to needing a cath specimen. Pt stated that she is still having the pain but feels it is due to her stones. Pt stated that she is going to check her schedule and then call back to make nurse appt.

## 2016-09-11 NOTE — Telephone Encounter (Signed)
-----   Message from Harle BattiestShannon A McGowan, PA-C sent at 09/08/2016  4:01 PM EST ----- Patient's urine grew out multiple species.  Since she is having suprapubic pain, I suggest she come in for a CATH UA for culture.

## 2016-09-11 NOTE — Telephone Encounter (Signed)
LMOM for patient to call back about lab results.

## 2016-09-12 ENCOUNTER — Ambulatory Visit (INDEPENDENT_AMBULATORY_CARE_PROVIDER_SITE_OTHER): Payer: BLUE CROSS/BLUE SHIELD

## 2016-09-12 VITALS — BP 109/72 | HR 72 | Ht 63.0 in | Wt 126.4 lb

## 2016-09-12 DIAGNOSIS — N200 Calculus of kidney: Secondary | ICD-10-CM | POA: Diagnosis not present

## 2016-09-12 LAB — MICROSCOPIC EXAMINATION: BACTERIA UA: NONE SEEN

## 2016-09-12 LAB — URINALYSIS, COMPLETE
Bilirubin, UA: NEGATIVE
GLUCOSE, UA: NEGATIVE
Ketones, UA: NEGATIVE
Leukocytes, UA: NEGATIVE
NITRITE UA: NEGATIVE
PH UA: 5.5 (ref 5.0–7.5)
PROTEIN UA: NEGATIVE
Specific Gravity, UA: 1.02 (ref 1.005–1.030)
UUROB: 0.2 mg/dL (ref 0.2–1.0)

## 2016-09-12 NOTE — Progress Notes (Signed)
In and Out Catheterization  Patient is present today for a I & O catheterization due to contaminated specimen. Patient was cleaned and prepped in a sterile fashion with betadine and Lidocaine 2% jelly was instilled into the urethra.  A 14FR cath was inserted no complications were noted , 100ml of urine return was noted, urine was clear yellow in color. A clean urine sample was collected for u/a and cx. Bladder was drained  And catheter was removed with out difficulty.    Preformed by: Kimberly Stackshelsea Sabel Hornbeck, LPN   Blood pressure 109/72, pulse 72, height 5\' 3"  (1.6 m), weight 126 lb 6.4 oz (57.3 kg).

## 2016-09-16 ENCOUNTER — Telehealth: Payer: Self-pay

## 2016-09-16 LAB — CULTURE, URINE COMPREHENSIVE

## 2016-09-16 NOTE — Telephone Encounter (Signed)
Spoke with pt in reference to -ucx. Pt voiced understanding.  

## 2016-09-16 NOTE — Telephone Encounter (Signed)
-----   Message from Harle BattiestShannon A McGowan, PA-C sent at 09/16/2016  8:13 AM EST ----- Patient's urine culture is negative.  She should have a CT pending.

## 2016-09-19 ENCOUNTER — Ambulatory Visit
Admission: RE | Admit: 2016-09-19 | Discharge: 2016-09-19 | Disposition: A | Discharge status: Home / Self Care | Payer: BLUE CROSS/BLUE SHIELD | Source: Ambulatory Visit | Attending: Urology | Admitting: Urology

## 2016-09-19 ENCOUNTER — Telehealth: Payer: Self-pay | Admitting: Urology

## 2016-09-19 DIAGNOSIS — N200 Calculus of kidney: Secondary | ICD-10-CM | POA: Insufficient documentation

## 2016-09-19 DIAGNOSIS — N29 Other disorders of kidney and ureter in diseases classified elsewhere: Secondary | ICD-10-CM | POA: Diagnosis present

## 2016-09-19 NOTE — Telephone Encounter (Signed)
Patient has no stones in her ureters at this time.  I suggest she undergo a 24 urine workup and then have an office visit to go over results and discuss treatment plans.

## 2016-09-19 NOTE — Telephone Encounter (Signed)
Spoke with pt in reference to CT results and needing a 24hr work up. Pt voiced understanding. Orders placed for litholink.

## 2016-09-20 ENCOUNTER — Ambulatory Visit: Payer: BLUE CROSS/BLUE SHIELD

## 2016-10-25 ENCOUNTER — Telehealth: Payer: Self-pay | Admitting: Obstetrics and Gynecology

## 2016-10-25 NOTE — Telephone Encounter (Signed)
pls advise

## 2016-10-25 NOTE — Telephone Encounter (Signed)
Would need to be seen first

## 2016-10-25 NOTE — Telephone Encounter (Signed)
Patient called stating she is in pain and needs a refill on hydrocodone.Thanks

## 2016-11-05 ENCOUNTER — Other Ambulatory Visit: Payer: Self-pay | Admitting: *Deleted

## 2016-11-05 ENCOUNTER — Telehealth: Payer: Self-pay | Admitting: *Deleted

## 2016-11-05 MED ORDER — TRIAZOLAM 0.25 MG PO TABS: ORAL_TABLET | ORAL | 3 refills | Status: DC

## 2016-11-05 MED ORDER — ALPRAZOLAM 0.5 MG PO TABS: 0.5000 mg | ORAL_TABLET | Freq: Two times a day (BID) | ORAL | 2 refills | Status: DC | PRN

## 2016-11-05 NOTE — Telephone Encounter (Signed)
Left pt detailed message would need to be seen

## 2016-11-05 NOTE — Telephone Encounter (Signed)
Patient called and states she needs a refill of her xanax and her triazolam. [facility] [place]. Please advise. Thank you

## 2016-11-05 NOTE — Telephone Encounter (Signed)
Done-ac 

## 2016-11-08 ENCOUNTER — Telehealth: Payer: Self-pay

## 2016-11-08 DIAGNOSIS — N200 Calculus of kidney: Secondary | ICD-10-CM

## 2016-11-08 MED ORDER — TAMSULOSIN HCL 0.4 MG PO CAPS: 0.4000 mg | ORAL_CAPSULE | Freq: Every day | ORAL | 1 refills | Status: DC

## 2016-11-08 NOTE — Telephone Encounter (Signed)
We can call in more Flomax.  Has she completed her 24 hour urine study?

## 2016-11-08 NOTE — Telephone Encounter (Signed)
Pt called stating she was seen about a month ago and CT showed tiny stones bilateral. Pt stated that she has been up all night with back pain and is missing work today. Reinforced with pt to push fluids, apply heat, and keep taking tylenol/ibuprofen. Pt requested more flomax. Please advise.

## 2016-11-08 NOTE — Telephone Encounter (Signed)
Spoke with pt in reference to flomax and 24hr urine. Pt stated that she has not completed her 24hr urine. Reinforced with pt to complete study. flomax sent to pharmacy. Pt voiced understanding of whole conversation.

## 2017-01-17 ENCOUNTER — Other Ambulatory Visit: Payer: Self-pay | Admitting: *Deleted

## 2017-01-17 ENCOUNTER — Telehealth: Payer: Self-pay | Admitting: Obstetrics and Gynecology

## 2017-01-17 MED ORDER — FLUOXETINE HCL 20 MG PO CAPS: 20.0000 mg | ORAL_CAPSULE | Freq: Every day | ORAL | 3 refills | Status: DC

## 2017-01-17 NOTE — Telephone Encounter (Signed)
[facility] [place]

## 2017-01-17 NOTE — Telephone Encounter (Signed)
Done-ac 

## 2017-02-27 ENCOUNTER — Telehealth: Payer: Self-pay | Admitting: Obstetrics and Gynecology

## 2017-02-27 ENCOUNTER — Other Ambulatory Visit: Payer: Self-pay | Admitting: Obstetrics and Gynecology

## 2017-02-27 MED ORDER — ALPRAZOLAM 0.5 MG PO TABS: 0.5000 mg | ORAL_TABLET | Freq: Two times a day (BID) | ORAL | 2 refills | Status: DC | PRN

## 2017-02-27 MED ORDER — TRIAZOLAM 0.25 MG PO TABS: ORAL_TABLET | ORAL | 3 refills | Status: DC

## 2017-02-27 NOTE — Telephone Encounter (Signed)
Patient notified-[facility] °

## 2017-02-27 NOTE — Telephone Encounter (Signed)
Patient called requesting a refill on xanax and triazolam. Thanks

## 2017-02-27 NOTE — Telephone Encounter (Signed)
Please let her know it is done 

## 2017-03-20 ENCOUNTER — Encounter: Payer: BLUE CROSS/BLUE SHIELD | Admitting: Obstetrics and Gynecology

## 2017-04-03 ENCOUNTER — Encounter: Payer: BLUE CROSS/BLUE SHIELD | Admitting: Obstetrics and Gynecology

## 2017-04-29 ENCOUNTER — Other Ambulatory Visit: Payer: Self-pay | Admitting: *Deleted

## 2017-04-29 ENCOUNTER — Telehealth: Payer: Self-pay | Admitting: Obstetrics and Gynecology

## 2017-04-29 MED ORDER — FLUOXETINE HCL 20 MG PO CAPS: 20.0000 mg | ORAL_CAPSULE | Freq: Every day | ORAL | 3 refills | Status: DC

## 2017-04-29 NOTE — Telephone Encounter (Signed)
Done-ac 

## 2017-04-29 NOTE — Telephone Encounter (Signed)
Patient Kimberly Romero asking to get a medication refill before her next Annual Exam visit on 06/12/2017. The patient did not disclose any  Other information other than wanting to speak with a nurse if possible. Please advise.

## 2017-06-12 ENCOUNTER — Encounter: Payer: BLUE CROSS/BLUE SHIELD | Admitting: Obstetrics and Gynecology

## 2017-06-12 ENCOUNTER — Other Ambulatory Visit: Payer: Self-pay | Admitting: *Deleted

## 2017-06-12 MED ORDER — TRIAZOLAM 0.25 MG PO TABS: ORAL_TABLET | ORAL | 3 refills | Status: DC

## 2017-06-12 MED ORDER — ALPRAZOLAM 0.5 MG PO TABS: 0.5000 mg | ORAL_TABLET | Freq: Two times a day (BID) | ORAL | 2 refills | Status: DC | PRN

## 2017-06-27 ENCOUNTER — Telehealth: Payer: Self-pay | Admitting: Obstetrics and Gynecology

## 2017-06-27 ENCOUNTER — Other Ambulatory Visit: Payer: Self-pay | Admitting: Obstetrics and Gynecology

## 2017-06-27 MED ORDER — ZOLPIDEM TARTRATE 5 MG PO TABS
5.0000 mg | ORAL_TABLET | Freq: Every evening | ORAL | 1 refills | Status: DC | PRN
Start: 2017-06-27 — End: 2018-07-28

## 2017-06-27 NOTE — Telephone Encounter (Signed)
Patient called stating she would has some questions she would like to discuss with you. Thanks

## 2017-06-27 NOTE — Telephone Encounter (Signed)
Pt states she isnt sleeping well, states she doesn't feel like her halicon is working any more, would like to go back on [place] ambien, she does have annual scheduled with you 07/15/17, pls advise

## 2017-07-15 ENCOUNTER — Encounter: Payer: Self-pay | Admitting: Obstetrics and Gynecology

## 2017-07-15 ENCOUNTER — Other Ambulatory Visit: Payer: Self-pay | Admitting: *Deleted

## 2017-07-15 ENCOUNTER — Ambulatory Visit: Payer: BLUE CROSS/BLUE SHIELD | Admitting: Obstetrics and Gynecology

## 2017-07-15 DIAGNOSIS — F5101 Primary insomnia: Secondary | ICD-10-CM

## 2017-07-15 MED ORDER — QUETIAPINE FUMARATE 25 MG PO TABS: 25.0000 mg | ORAL_TABLET | Freq: Every day | ORAL | 2 refills | Status: DC

## 2017-07-25 ENCOUNTER — Ambulatory Visit (INDEPENDENT_AMBULATORY_CARE_PROVIDER_SITE_OTHER): Payer: BLUE CROSS/BLUE SHIELD | Admitting: Obstetrics and Gynecology

## 2017-07-25 ENCOUNTER — Encounter: Payer: Self-pay | Admitting: Obstetrics and Gynecology

## 2017-07-25 VITALS — BP 110/69 | HR 88 | Ht 63.0 in | Wt 121.3 lb

## 2017-07-25 DIAGNOSIS — F5101 Primary insomnia: Secondary | ICD-10-CM | POA: Diagnosis not present

## 2017-07-25 DIAGNOSIS — Z01419 Encounter for gynecological examination (general) (routine) without abnormal findings: Secondary | ICD-10-CM

## 2017-07-25 DIAGNOSIS — Z23 Encounter for immunization: Secondary | ICD-10-CM

## 2017-07-25 NOTE — Patient Instructions (Signed)
Preventive Care 18-39 Years, Female Preventive care refers to lifestyle choices and visits with your health care provider that can promote health and wellness. What does preventive care include?  A yearly physical exam. This is also called an annual well check.  Dental exams once or twice a year.  Routine eye exams. Ask your health care provider how often you should have your eyes checked.  Personal lifestyle choices, including: ? Daily care of your teeth and gums. ? Regular physical activity. ? Eating a healthy diet. ? Avoiding tobacco and drug use. ? Limiting alcohol use. ? Practicing safe sex. ? Taking vitamin and mineral supplements as recommended by your health care provider. What happens during an annual well check? The services and screenings done by your health care provider during your annual well check will depend on your age, overall health, lifestyle risk factors, and family history of disease. Counseling Your health care provider may ask you questions about your:  Alcohol use.  Tobacco use.  Drug use.  Emotional well-being.  Home and relationship well-being.  Sexual activity.  Eating habits.  Work and work Statistician.  Method of birth control.  Menstrual cycle.  Pregnancy history.  Screening You may have the following tests or measurements:  Height, weight, and BMI.  Diabetes screening. This is done by checking your blood sugar (glucose) after you have not eaten for a while (fasting).  Blood pressure.  Lipid and cholesterol levels. These may be checked every 5 years starting at age 38.  Skin check.  Hepatitis C blood test.  Hepatitis B blood test.  Sexually transmitted disease (STD) testing.  BRCA-related cancer screening. This may be done if you have a family history of breast, ovarian, tubal, or peritoneal cancers.  Pelvic exam and Pap test. This may be done every 3 years starting at age 38. Starting at age 30, this may be done  every 5 years if you have a Pap test in combination with an HPV test.  Discuss your test results, treatment options, and if necessary, the need for more tests with your health care provider. Vaccines Your health care provider may recommend certain vaccines, such as:  Influenza vaccine. This is recommended every year.  Tetanus, diphtheria, and acellular pertussis (Tdap, Td) vaccine. You may need a Td booster every 10 years.  Varicella vaccine. You may need this if you have not been vaccinated.  HPV vaccine. If you are 39 or younger, you may need three doses over 6 months.  Measles, mumps, and rubella (MMR) vaccine. You may need at least one dose of MMR. You may also need a second dose.  Pneumococcal 13-valent conjugate (PCV13) vaccine. You may need this if you have certain conditions and were not previously vaccinated.  Pneumococcal polysaccharide (PPSV23) vaccine. You may need one or two doses if you smoke cigarettes or if you have certain conditions.  Meningococcal vaccine. One dose is recommended if you are age 68-21 years and a first-year college student living in a residence hall, or if you have one of several medical conditions. You may also need additional booster doses.  Hepatitis A vaccine. You may need this if you have certain conditions or if you travel or work in places where you may be exposed to hepatitis A.  Hepatitis B vaccine. You may need this if you have certain conditions or if you travel or work in places where you may be exposed to hepatitis B.  Haemophilus influenzae type b (Hib) vaccine. You may need this  if you have certain risk factors.  Talk to your health care provider about which screenings and vaccines you need and how often you need them. This information is not intended to replace advice given to you by your health care provider. Make sure you discuss any questions you have with your health care provider. Document Released: 10/01/2001 Document Revised:  04/24/2016 Document Reviewed: 06/06/2015 Elsevier Interactive Patient Education  2017 Elsevier Inc.  

## 2017-07-25 NOTE — Progress Notes (Signed)
Subjective:   Kimberly Romero is a 36 y.o. 642P0 Caucasian female here for a routine well-woman exam.  No LMP recorded. Patient is not currently having periods (Reason: IUD).    Current complaints: none PCP: me       doesn't desire labs  Social History: Sexual: heterosexual Marital Status: married Living situation: with family Occupation: self-employed Tobacco/alcohol: no tobacco use Illicit drugs: no history of illicit drug use  The following portions of the patient's history were reviewed and updated as appropriate: allergies, current medications, past family history, past medical history, past social history, past surgical history and problem list.  Past Medical History Past Medical History:  Diagnosis Date  . Anxiety   . Depression   . Disturbed sleep rhythm   . Enlarged thyroid   . History of hypothyroidism   . Vaginal Pap smear, abnormal    years ago    Past Surgical History Past Surgical History:  Procedure Laterality Date  . BREAST SURGERY    . CESAREAN SECTION     X 2    Gynecologic History G2P0  No LMP recorded. Patient is not currently having periods (Reason: IUD). Contraception: IUD Last Pap: 2016. Results were: normal   Obstetric History OB History  Gravida Para Term Preterm AB Living  2         2  SAB TAB Ectopic Multiple Live Births          2    # Outcome Date GA Lbr Len/2nd Weight Sex Delivery Anes PTL Lv  2 Gravida 2011    M CS-Unspec   LIV  1 Gravida 2006    M CS-Unspec   LIV      Current Medications Current Outpatient Medications on File Prior to Visit  Medication Sig Dispense Refill  . ALPRAZolam (XANAX) 0.5 MG tablet Take 1 tablet (0.5 mg total) by mouth 2 (two) times daily as needed for anxiety. 60 tablet 2  . FLUoxetine (PROZAC) 20 MG capsule Take 1 capsule (20 mg total) by mouth daily. 30 capsule 3  . ibuprofen (ADVIL,MOTRIN) 100 MG tablet Take 100 mg by mouth as needed for fever.    Marland Kitchen. levonorgestrel (MIRENA) 20 MCG/24HR IUD 1  each by Intrauterine route once.    Marland Kitchen. QUEtiapine (SEROQUEL) 25 MG tablet Take 1 tablet (25 mg total) by mouth at bedtime. 30 tablet 2  . HYDROcodone-acetaminophen (NORCO/VICODIN) 5-325 MG tablet Take 1 tablet by mouth every 6 (six) hours as needed. (Patient not taking: Reported on 07/15/2017) 30 tablet 0  . tamsulosin (FLOMAX) 0.4 MG CAPS capsule Take 1 capsule (0.4 mg total) by mouth daily. (Patient not taking: Reported on 07/15/2017) 30 capsule 1  . zolpidem (AMBIEN) 5 MG tablet Take 1 tablet (5 mg total) at bedtime as needed by mouth for sleep. (Patient not taking: Reported on 07/25/2017) 30 tablet 1   No current facility-administered medications on file prior to visit.     Review of Systems Patient denies any headaches, blurred vision, shortness of breath, chest pain, abdominal pain, problems with bowel movements, urination, or intercourse.  Objective:  BP 110/69   Pulse 88   Ht 5\' 3"  (1.6 m)   Wt 121 lb 4.8 oz (55 kg)   BMI 21.49 kg/m  Physical Exam  General:  Well developed, well nourished, no acute distress. She is alert and oriented x3. Skin:  Warm and dry Neck:  Midline trachea, no thyromegaly or nodules Cardiovascular: Regular rate and rhythm, no murmur heard Lungs:  Effort  normal, all lung fields clear to auscultation bilaterally Breasts:  No dominant palpable mass, retraction, or nipple discharge Abdomen:  Soft, non tender, no hepatosplenomegaly or masses Pelvic:  External genitalia is normal in appearance.  The vagina is normal in appearance. The cervix is bulbous, no CMT. IUD string noted.  Thin prep pap is not done . Uterus is felt to be normal size, shape, and contour.  No adnexal masses or tenderness noted. Extremities:  No swelling or varicosities noted Psych:  She has a normal mood and affect  Assessment:   Healthy well-woman exam IUD check Needs flu vaccine  Plan:  Flu vaccine given F/U 1 year for AE, or sooner if needed   Melody Suzan NailerN Shambley, CNM

## 2017-08-14 ENCOUNTER — Other Ambulatory Visit: Payer: Self-pay | Admitting: *Deleted

## 2017-08-14 ENCOUNTER — Telehealth: Payer: Self-pay | Admitting: *Deleted

## 2017-08-14 MED ORDER — ALPRAZOLAM 0.5 MG PO TABS: 0.5000 mg | ORAL_TABLET | Freq: Two times a day (BID) | ORAL | 2 refills | Status: DC | PRN

## 2017-08-14 MED ORDER — HYDROCODONE-ACETAMINOPHEN 5-325 MG PO TABS: 1.0000 | ORAL_TABLET | Freq: Four times a day (QID) | ORAL | 0 refills | Status: DC | PRN

## 2017-08-14 NOTE — Telephone Encounter (Signed)
Patient called and states she needs a refill of her xanax and her Hydrocodone. Patient states if theses can be refill to call when ready for pick up. Her contact number [phone]. Please advise. Thank you

## 2017-08-14 NOTE — Telephone Encounter (Signed)
rx printed waiting on MNS to sign

## 2017-08-27 ENCOUNTER — Other Ambulatory Visit: Payer: Self-pay | Admitting: Obstetrics and Gynecology

## 2017-09-05 ENCOUNTER — Telehealth: Payer: Self-pay | Admitting: Obstetrics and Gynecology

## 2017-09-05 NOTE — Telephone Encounter (Signed)
The patient called an stated that she  Would like Amy to give her a call back in regards to her needing a refill of HYDROcodone-acetaminophen (NORCO/VICODIN) 5-325 MG tablet for her kidney stones. No other information was disclosed, Please advise.

## 2017-09-09 ENCOUNTER — Telehealth: Payer: Self-pay | Admitting: Obstetrics and Gynecology

## 2017-09-09 NOTE — Telephone Encounter (Signed)
The patient lvm stating that her provider is Melody and that she also needs a medication refill of HYDROcodone-acetaminophen (NORCO/VICODIN) 5-325 MG tablet, The patient did not disclose any other information. Please advise.

## 2017-09-11 ENCOUNTER — Telehealth: Payer: Self-pay | Admitting: Obstetrics and Gynecology

## 2017-09-11 NOTE — Telephone Encounter (Signed)
Left vm to call back-ac

## 2017-09-11 NOTE — Telephone Encounter (Signed)
The patient called again and stated:   The patient lvm stating that her provider is Melody and that she also needs a medication refill of HYDROcodone-acetaminophen (NORCO/VICODIN) 5-325 MG tablet, The patient did not disclose any other information. Please advise.

## 2017-09-12 NOTE — Telephone Encounter (Signed)
Notified pt MNS out of the office

## 2017-09-15 NOTE — Telephone Encounter (Signed)
Notified pt. 

## 2017-09-15 NOTE — Telephone Encounter (Signed)
Patient called again about her medication. Patient is requesting a call back. Her contact # is [phone]. Please advise. Thank you

## 2017-09-29 ENCOUNTER — Other Ambulatory Visit: Payer: Self-pay | Admitting: Obstetrics and Gynecology

## 2017-10-02 ENCOUNTER — Other Ambulatory Visit: Payer: Self-pay | Admitting: Obstetrics and Gynecology

## 2017-10-02 DIAGNOSIS — F5101 Primary insomnia: Secondary | ICD-10-CM

## 2017-11-04 ENCOUNTER — Other Ambulatory Visit: Payer: Self-pay | Admitting: Obstetrics and Gynecology

## 2017-11-04 ENCOUNTER — Telehealth: Payer: Self-pay | Admitting: Obstetrics and Gynecology

## 2017-11-04 DIAGNOSIS — F5101 Primary insomnia: Secondary | ICD-10-CM

## 2017-11-04 MED ORDER — HYDROCODONE-ACETAMINOPHEN 5-325 MG PO TABS: 1.0000 | ORAL_TABLET | Freq: Four times a day (QID) | ORAL | 0 refills | Status: DC | PRN

## 2017-11-04 MED ORDER — QUETIAPINE FUMARATE 25 MG PO TABS: ORAL_TABLET | ORAL | 6 refills | Status: DC

## 2017-11-04 NOTE — Telephone Encounter (Signed)
The patient called and stated that she would like to have her provider fill these two prescriptions QUEtiapine (SEROQUEL) 25 MG tablet, and HYDROcodone-acetaminophen (NORCO/VICODIN) 5-325 MG tablet. The patient did not disclose any other information. Please advise.

## 2017-11-04 NOTE — Telephone Encounter (Signed)
pls advise

## 2017-11-27 ENCOUNTER — Telehealth: Payer: Self-pay | Admitting: Obstetrics and Gynecology

## 2017-11-27 NOTE — Telephone Encounter (Signed)
The patient called and stated that she would like to speak with Amy in regards to getting her medication changed. Please advise.

## 2017-12-01 NOTE — Telephone Encounter (Signed)
Patient called and states she needs a refill of her xanax. Patient pharmacy is [facility] in [place]. Patient wanting to change her medication Prozac from daily to PRN . Patient thinks that she don't think she needs it every day. Patient states you can call her if you have any questions . Her contact # [phone]. Please advise. Thank you

## 2017-12-02 ENCOUNTER — Other Ambulatory Visit: Payer: Self-pay | Admitting: *Deleted

## 2017-12-02 MED ORDER — ALPRAZOLAM 0.5 MG PO TABS: 0.5000 mg | ORAL_TABLET | Freq: Two times a day (BID) | ORAL | 2 refills | Status: DC | PRN

## 2017-12-02 NOTE — Telephone Encounter (Signed)
Done-ac 

## 2017-12-03 ENCOUNTER — Other Ambulatory Visit: Payer: Self-pay | Admitting: Obstetrics and Gynecology

## 2017-12-08 ENCOUNTER — Telehealth: Payer: Self-pay | Admitting: *Deleted

## 2017-12-08 NOTE — Telephone Encounter (Signed)
Pt doesn't like her prozac and the way it makes her feel, she would like to have something to take more for "situational" I advised her she had her xanax she states she takes that at night, pls advise

## 2017-12-08 NOTE — Telephone Encounter (Signed)
Unfortunately we are limited to a daily preventative (like prozac or another SSRI) or the xanax like you told her. She could try 1/2 dose of xanax when she needs it.

## 2017-12-15 ENCOUNTER — Telehealth: Payer: Self-pay | Admitting: Obstetrics and Gynecology

## 2017-12-15 NOTE — Telephone Encounter (Signed)
The patient called and stated that she would like to speak with Amy in regards to changing her prescription. Please advise.

## 2017-12-16 ENCOUNTER — Other Ambulatory Visit: Payer: Self-pay | Admitting: *Deleted

## 2017-12-16 MED ORDER — ALPRAZOLAM 1 MG PO TABS: 1.0000 mg | ORAL_TABLET | Freq: Every evening | ORAL | 2 refills | Status: DC | PRN

## 2017-12-16 NOTE — Telephone Encounter (Signed)
Pt would like to go up on her mg of xanax, having some increased stress

## 2017-12-16 NOTE — Telephone Encounter (Signed)
Has tried to double her dose, and if so did it help? If not have her start there and let us know  If so I can send in a prescription for the  tablets.

## 2017-12-16 NOTE — Telephone Encounter (Signed)
Yes she has doubled up and works well

## 2018-01-30 ENCOUNTER — Other Ambulatory Visit: Payer: Self-pay | Admitting: Obstetrics and Gynecology

## 2018-01-30 DIAGNOSIS — F5101 Primary insomnia: Secondary | ICD-10-CM

## 2018-02-25 ENCOUNTER — Telehealth: Payer: Self-pay | Admitting: Obstetrics and Gynecology

## 2018-02-25 NOTE — Telephone Encounter (Signed)
Patient called requesting a refill on Hydrocodone. Thanks

## 2018-02-26 ENCOUNTER — Other Ambulatory Visit: Payer: Self-pay | Admitting: *Deleted

## 2018-02-26 MED ORDER — HYDROCODONE-ACETAMINOPHEN 5-325 MG PO TABS: 1.0000 | ORAL_TABLET | Freq: Four times a day (QID) | ORAL | 0 refills | Status: DC | PRN

## 2018-02-26 NOTE — Telephone Encounter (Signed)
pls advise

## 2018-06-12 ENCOUNTER — Other Ambulatory Visit: Payer: Self-pay | Admitting: Obstetrics and Gynecology

## 2018-06-15 ENCOUNTER — Telehealth: Payer: Self-pay | Admitting: Obstetrics and Gynecology

## 2018-06-15 NOTE — Telephone Encounter (Signed)
The patient called and stated that she needs a refill of her prescription HYDROcodone-acetaminophen (NORCO/VICODIN) 5-325 MG tablet, The patient would like a call to confirm that the prescription has been sent in. Please advise.

## 2018-06-18 NOTE — Telephone Encounter (Signed)
pls advise

## 2018-06-19 ENCOUNTER — Other Ambulatory Visit: Payer: Self-pay | Admitting: Obstetrics and Gynecology

## 2018-06-19 MED ORDER — HYDROCODONE-ACETAMINOPHEN 5-325 MG PO TABS: 2.0000 | ORAL_TABLET | Freq: Four times a day (QID) | ORAL | 0 refills | Status: AC | PRN

## 2018-06-19 NOTE — Telephone Encounter (Signed)
Done, but did decrease amount per national guidelines and restrictions

## 2018-07-22 ENCOUNTER — Other Ambulatory Visit: Payer: Self-pay | Admitting: *Deleted

## 2018-07-22 ENCOUNTER — Telehealth: Payer: Self-pay | Admitting: Obstetrics and Gynecology

## 2018-07-22 MED ORDER — CIPROFLOXACIN HCL 500 MG PO TABS: 500.0000 mg | ORAL_TABLET | Freq: Two times a day (BID) | ORAL | 0 refills | Status: DC

## 2018-07-22 NOTE — Telephone Encounter (Signed)
Patient called stating she has a bladder infection

## 2018-07-28 ENCOUNTER — Encounter: Payer: Self-pay | Admitting: Obstetrics and Gynecology

## 2018-07-28 ENCOUNTER — Ambulatory Visit (INDEPENDENT_AMBULATORY_CARE_PROVIDER_SITE_OTHER): Payer: BLUE CROSS/BLUE SHIELD | Admitting: Obstetrics and Gynecology

## 2018-07-28 VITALS — BP 123/69 | HR 87 | Ht 63.0 in | Wt 121.5 lb

## 2018-07-28 DIAGNOSIS — Z01419 Encounter for gynecological examination (general) (routine) without abnormal findings: Secondary | ICD-10-CM | POA: Diagnosis not present

## 2018-07-28 NOTE — Patient Instructions (Signed)
Preventive Care 18-39 Years, Female Preventive care refers to lifestyle choices and visits with your health care provider that can promote health and wellness. What does preventive care include?  A yearly physical exam. This is also called an annual well check.  Dental exams once or twice a year.  Routine eye exams. Ask your health care provider how often you should have your eyes checked.  Personal lifestyle choices, including: ? Daily care of your teeth and gums. ? Regular physical activity. ? Eating a healthy diet. ? Avoiding tobacco and drug use. ? Limiting alcohol use. ? Practicing safe sex. ? Taking vitamin and mineral supplements as recommended by your health care provider. What happens during an annual well check? The services and screenings done by your health care provider during your annual well check will depend on your age, overall health, lifestyle risk factors, and family history of disease. Counseling Your health care provider may ask you questions about your:  Alcohol use.  Tobacco use.  Drug use.  Emotional well-being.  Home and relationship well-being.  Sexual activity.  Eating habits.  Work and work Statistician.  Method of birth control.  Menstrual cycle.  Pregnancy history.  Screening You may have the following tests or measurements:  Height, weight, and BMI.  Diabetes screening. This is done by checking your blood sugar (glucose) after you have not eaten for a while (fasting).  Blood pressure.  Lipid and cholesterol levels. These may be checked every 5 years starting at age 38.  Skin check.  Hepatitis C blood test.  Hepatitis B blood test.  Sexually transmitted disease (STD) testing.  BRCA-related cancer screening. This may be done if you have a family history of breast, ovarian, tubal, or peritoneal cancers.  Pelvic exam and Pap test. This may be done every 3 years starting at age 38. Starting at age 30, this may be done  every 5 years if you have a Pap test in combination with an HPV test.  Discuss your test results, treatment options, and if necessary, the need for more tests with your health care provider. Vaccines Your health care provider may recommend certain vaccines, such as:  Influenza vaccine. This is recommended every year.  Tetanus, diphtheria, and acellular pertussis (Tdap, Td) vaccine. You may need a Td booster every 10 years.  Varicella vaccine. You may need this if you have not been vaccinated.  HPV vaccine. If you are 39 or younger, you may need three doses over 6 months.  Measles, mumps, and rubella (MMR) vaccine. You may need at least one dose of MMR. You may also need a second dose.  Pneumococcal 13-valent conjugate (PCV13) vaccine. You may need this if you have certain conditions and were not previously vaccinated.  Pneumococcal polysaccharide (PPSV23) vaccine. You may need one or two doses if you smoke cigarettes or if you have certain conditions.  Meningococcal vaccine. One dose is recommended if you are age 68-21 years and a first-year college student living in a residence hall, or if you have one of several medical conditions. You may also need additional booster doses.  Hepatitis A vaccine. You may need this if you have certain conditions or if you travel or work in places where you may be exposed to hepatitis A.  Hepatitis B vaccine. You may need this if you have certain conditions or if you travel or work in places where you may be exposed to hepatitis B.  Haemophilus influenzae type b (Hib) vaccine. You may need this  if you have certain risk factors.  Talk to your health care provider about which screenings and vaccines you need and how often you need them. This information is not intended to replace advice given to you by your health care provider. Make sure you discuss any questions you have with your health care provider. Document Released: 10/01/2001 Document Revised:  04/24/2016 Document Reviewed: 06/06/2015 Elsevier Interactive Patient Education  2018 Elsevier Inc.  

## 2018-07-28 NOTE — Progress Notes (Signed)
Subjective:   Kimberly Romero is a 37 y.o. G75P0 Caucasian female here for a routine well-woman exam.  No LMP recorded. (Menstrual status: IUD).    Current complaints: none except feeling better since UTI last week. PCP: me       does desire labs  Social History: Sexual: heterosexual Marital Status: separated Living situation: with family Occupation: unknown occupation Tobacco/alcohol: no tobacco use Illicit drugs: no history of illicit drug use  The following portions of the patient's history were reviewed and updated as appropriate: allergies, current medications, past family history, past medical history, past social history, past surgical history and problem list.  Past Medical History Past Medical History:  Diagnosis Date  . Anxiety   . Depression   . Disturbed sleep rhythm   . Enlarged thyroid   . History of hypothyroidism   . Vaginal Pap smear, abnormal    years ago    Past Surgical History Past Surgical History:  Procedure Laterality Date  . BREAST SURGERY    . CESAREAN SECTION     X 2    Gynecologic History G2P0  No LMP recorded. (Menstrual status: IUD). Contraception: IUD Last Pap: 2016. Results were: normal   Obstetric History OB History  Gravida Para Term Preterm AB Living  2         2  SAB TAB Ectopic Multiple Live Births          2    # Outcome Date GA Lbr Len/2nd Weight Sex Delivery Anes PTL Lv  2 Gravida 2011    M CS-Unspec   LIV  1 Gravida 2006    M CS-Unspec   LIV    Current Medications Current Outpatient Medications on File Prior to Visit  Medication Sig Dispense Refill  . ALPRAZolam (XANAX) 1 MG tablet Take 1 tablet (1 mg total) by mouth at bedtime as needed for anxiety. 60 tablet 2  . levonorgestrel (MIRENA) 20 MCG/24HR IUD 1 each by Intrauterine route once.    Marland Kitchen QUEtiapine (SEROQUEL) 25 MG tablet TAKE 1 TABLET(25 MG) BY MOUTH AT BEDTIME 30 tablet 6  . ALPRAZolam (XANAX) 0.5 MG tablet TAKE 1 TABLET BY MOUTH TWICE DAILY AS NEEDED FOR  ANXIETY (Patient not taking: Reported on 07/28/2018) 60 tablet 0  . ciprofloxacin (CIPRO) 500 MG tablet Take 1 tablet (500 mg total) by mouth 2 (two) times daily. (Patient not taking: Reported on 07/28/2018) 14 tablet 0  . FLUoxetine (PROZAC) 20 MG capsule TAKE 1 CAPSULE(20 MG) BY MOUTH DAILY (Patient not taking: Reported on 07/28/2018) 30 capsule 4  . ibuprofen (ADVIL,MOTRIN) 100 MG tablet Take 100 mg by mouth as needed for fever.    . tamsulosin (FLOMAX) 0.4 MG CAPS capsule Take 1 capsule (0.4 mg total) by mouth daily. (Patient not taking: Reported on 07/15/2017) 30 capsule 1   No current facility-administered medications on file prior to visit.     Review of Systems Patient denies any headaches, blurred vision, shortness of breath, chest pain, abdominal pain, problems with bowel movements, urination, or intercourse.  Objective:  BP 123/69   Pulse 87   Ht 5\' 3"  (1.6 m)   Wt 121 lb 8 oz (55.1 kg)   BMI 21.52 kg/m  Physical Exam  General:  Well developed, well nourished, no acute distress. She is alert and oriented x3. Skin:  Warm and dry Neck:  Midline trachea, no thyromegaly or nodules Cardiovascular: Regular rate and rhythm, no murmur heard Lungs:  Effort normal, all lung fields clear to  auscultation bilaterally Breasts:  No dominant palpable mass, retraction, or nipple discharge bilateral implants noted without defect Abdomen:  Soft, non tender, no hepatosplenomegaly or masses Pelvic:  External genitalia is normal in appearance.  The vagina is normal in appearance. The cervix is bulbous, no CMT.  Thin prep pap is done with HR HPV cotesting. Uterus is felt to be normal size, shape, and contour.  No adnexal masses or tenderness noted.IUD string noted just inside cervix. Extremities:  No swelling or varicosities noted Psych:  She has a normal mood and affect  Assessment:   Healthy well-woman exam IUD check Renal calculi  Plan:  UA rechecked. Labs obtained will follow up  accordingly F/U 1 year for AE, or sooner if needed Mammogram baseline needed  Teigen Bellin Suzan NailerN Jamisha Hoeschen, CNM

## 2018-07-29 ENCOUNTER — Telehealth: Payer: Self-pay | Admitting: Obstetrics and Gynecology

## 2018-07-29 ENCOUNTER — Other Ambulatory Visit: Payer: Self-pay | Admitting: *Deleted

## 2018-07-29 DIAGNOSIS — F5101 Primary insomnia: Secondary | ICD-10-CM

## 2018-07-29 LAB — COMPREHENSIVE METABOLIC PANEL
A/G RATIO: 2 (ref 1.2–2.2)
ALBUMIN: 4.5 g/dL (ref 3.5–5.5)
ALT: 13 IU/L (ref 0–32)
AST: 18 IU/L (ref 0–40)
Alkaline Phosphatase: 44 IU/L (ref 39–117)
BUN / CREAT RATIO: 10 (ref 9–23)
BUN: 10 mg/dL (ref 6–20)
Bilirubin Total: 0.3 mg/dL (ref 0.0–1.2)
CALCIUM: 8.9 mg/dL (ref 8.7–10.2)
CHLORIDE: 101 mmol/L (ref 96–106)
CO2: 25 mmol/L (ref 20–29)
CREATININE: 1.01 mg/dL — AB (ref 0.57–1.00)
GFR calc non Af Amer: 70 mL/min/{1.73_m2} (ref >59)
GFR, EST AFRICAN AMERICAN: 81 mL/min/{1.73_m2} (ref >59)
Globulin, Total: 2.3 g/dL (ref 1.5–4.5)
Glucose: 86 mg/dL (ref 65–99)
Potassium: 4 mmol/L (ref 3.5–5.2)
SODIUM: 139 mmol/L (ref 134–144)
TOTAL PROTEIN: 6.8 g/dL (ref 6.0–8.5)

## 2018-07-29 LAB — LIPID PANEL
CHOL/HDL RATIO: 2 ratio (ref 0.0–4.4)
Cholesterol, Total: 147 mg/dL (ref 100–199)
HDL: 72 mg/dL (ref >39)
LDL CALC: 67 mg/dL (ref 0–99)
Triglycerides: 38 mg/dL (ref 0–149)
VLDL Cholesterol Cal: 8 mg/dL (ref 5–40)

## 2018-07-29 LAB — HEMOGLOBIN A1C
ESTIMATED AVERAGE GLUCOSE: 105 mg/dL
Hgb A1c MFr Bld: 5.3 % (ref 4.8–5.6)

## 2018-07-29 MED ORDER — QUETIAPINE FUMARATE 50 MG PO TABS: ORAL_TABLET | ORAL | 2 refills | Status: DC

## 2018-07-29 MED ORDER — ALPRAZOLAM 1 MG PO TABS: 1.0000 mg | ORAL_TABLET | Freq: Every evening | ORAL | 2 refills | Status: DC | PRN

## 2018-07-29 NOTE — Telephone Encounter (Signed)
Faxed to pharmacy

## 2018-07-29 NOTE — Telephone Encounter (Signed)
The patient is asking about her scripts and if they will be called in today?  She called the pharmacy and they told her they did not receive anything, please advise, thanks.

## 2018-07-30 LAB — PAP IG AND HPV HIGH-RISK: HPV, HIGH-RISK: NEGATIVE

## 2018-08-17 ENCOUNTER — Telehealth: Payer: Self-pay | Admitting: Obstetrics and Gynecology

## 2018-08-17 NOTE — Telephone Encounter (Signed)
The patient called and stated that she needs a refill of her medication Hydrocodone for her kidney stones. The patient did not disclose any other information. Please advise.

## 2018-08-17 NOTE — Telephone Encounter (Signed)
pls advise

## 2018-08-18 ENCOUNTER — Other Ambulatory Visit: Payer: Self-pay | Admitting: Obstetrics and Gynecology

## 2018-08-18 NOTE — Telephone Encounter (Signed)
She needs to get this from urology, please have her contact there office.

## 2018-08-21 NOTE — Telephone Encounter (Signed)
Notified by Ricky Stabs she needs to follow up with urology

## 2018-10-18 ENCOUNTER — Other Ambulatory Visit: Payer: Self-pay | Admitting: Obstetrics and Gynecology

## 2018-10-18 DIAGNOSIS — F5101 Primary insomnia: Secondary | ICD-10-CM

## 2018-11-04 ENCOUNTER — Other Ambulatory Visit: Payer: Self-pay | Admitting: Obstetrics and Gynecology

## 2018-12-15 ENCOUNTER — Other Ambulatory Visit: Payer: Self-pay | Admitting: Obstetrics and Gynecology

## 2018-12-15 MED ORDER — FLUOXETINE HCL 20 MG PO CAPS: ORAL_CAPSULE | ORAL | 4 refills | Status: DC

## 2019-01-15 ENCOUNTER — Other Ambulatory Visit: Payer: Self-pay

## 2019-01-15 MED ORDER — CIPROFLOXACIN HCL 500 MG PO TABS: 500.0000 mg | ORAL_TABLET | Freq: Two times a day (BID) | ORAL | 0 refills | Status: DC

## 2019-01-28 ENCOUNTER — Other Ambulatory Visit: Payer: Self-pay | Admitting: *Deleted

## 2019-01-28 MED ORDER — ALPRAZOLAM 1 MG PO TABS: 1.0000 mg | ORAL_TABLET | Freq: Every evening | ORAL | 2 refills | Status: DC | PRN

## 2019-01-29 ENCOUNTER — Other Ambulatory Visit: Payer: Self-pay | Admitting: Obstetrics and Gynecology

## 2019-01-29 MED ORDER — HYDROCODONE-ACETAMINOPHEN 5-325 MG PO TABS: 1.0000 | ORAL_TABLET | Freq: Four times a day (QID) | ORAL | 0 refills | Status: DC | PRN

## 2019-02-10 ENCOUNTER — Other Ambulatory Visit: Payer: Self-pay | Admitting: Obstetrics and Gynecology

## 2019-02-10 ENCOUNTER — Ambulatory Visit
Admission: RE | Admit: 2019-02-10 | Discharge: 2019-02-10 | Disposition: A | Discharge status: Home / Self Care | Payer: BC Managed Care – PPO | Source: Ambulatory Visit | Attending: Obstetrics and Gynecology | Admitting: Obstetrics and Gynecology

## 2019-02-10 ENCOUNTER — Other Ambulatory Visit: Payer: Self-pay

## 2019-02-10 DIAGNOSIS — Z1231 Encounter for screening mammogram for malignant neoplasm of breast: Secondary | ICD-10-CM | POA: Insufficient documentation

## 2019-02-10 DIAGNOSIS — Z01419 Encounter for gynecological examination (general) (routine) without abnormal findings: Secondary | ICD-10-CM

## 2019-03-02 ENCOUNTER — Other Ambulatory Visit: Payer: Self-pay | Admitting: Obstetrics and Gynecology

## 2019-03-02 DIAGNOSIS — F5101 Primary insomnia: Secondary | ICD-10-CM

## 2019-03-04 ENCOUNTER — Other Ambulatory Visit: Payer: Self-pay

## 2019-03-12 ENCOUNTER — Other Ambulatory Visit: Payer: Self-pay

## 2019-03-17 MED ORDER — HYDROCODONE-ACETAMINOPHEN 5-325 MG PO TABS: 1.0000 | ORAL_TABLET | Freq: Four times a day (QID) | ORAL | 0 refills | Status: DC | PRN

## 2019-03-17 NOTE — Telephone Encounter (Signed)
Please advice  

## 2019-03-30 ENCOUNTER — Other Ambulatory Visit: Payer: Self-pay | Admitting: Obstetrics and Gynecology

## 2019-03-30 DIAGNOSIS — F5101 Primary insomnia: Secondary | ICD-10-CM

## 2019-03-31 ENCOUNTER — Encounter: Payer: Self-pay | Admitting: *Deleted

## 2019-04-25 ENCOUNTER — Other Ambulatory Visit: Payer: Self-pay | Admitting: Obstetrics and Gynecology

## 2019-05-22 ENCOUNTER — Other Ambulatory Visit: Payer: Self-pay | Admitting: Obstetrics and Gynecology

## 2019-05-22 ENCOUNTER — Other Ambulatory Visit: Payer: Self-pay

## 2019-05-22 DIAGNOSIS — F5101 Primary insomnia: Secondary | ICD-10-CM

## 2019-05-24 NOTE — Telephone Encounter (Signed)
pls advise

## 2019-05-25 MED ORDER — HYDROCODONE-ACETAMINOPHEN 5-325 MG PO TABS: 1.0000 | ORAL_TABLET | Freq: Four times a day (QID) | ORAL | 0 refills | Status: DC | PRN

## 2019-05-31 ENCOUNTER — Encounter: Payer: Self-pay | Admitting: Podiatry

## 2019-05-31 ENCOUNTER — Ambulatory Visit (INDEPENDENT_AMBULATORY_CARE_PROVIDER_SITE_OTHER): Payer: BC Managed Care – PPO | Admitting: Podiatry

## 2019-05-31 ENCOUNTER — Ambulatory Visit: Payer: BC Managed Care – PPO

## 2019-05-31 ENCOUNTER — Ambulatory Visit (INDEPENDENT_AMBULATORY_CARE_PROVIDER_SITE_OTHER): Payer: BC Managed Care – PPO

## 2019-05-31 ENCOUNTER — Other Ambulatory Visit: Payer: Self-pay

## 2019-05-31 DIAGNOSIS — M2011 Hallux valgus (acquired), right foot: Secondary | ICD-10-CM

## 2019-05-31 DIAGNOSIS — M2012 Hallux valgus (acquired), left foot: Secondary | ICD-10-CM

## 2019-05-31 DIAGNOSIS — M79671 Pain in right foot: Secondary | ICD-10-CM

## 2019-05-31 DIAGNOSIS — F419 Anxiety disorder, unspecified: Secondary | ICD-10-CM | POA: Insufficient documentation

## 2019-05-31 NOTE — Patient Instructions (Addendum)
Bunion  A bunion is a bump on the base of the big toe that forms when the bones of the big toe joint move out of position. Bunions may be small at first, but they often get larger over time. They can make walking painful. What are the causes? A bunion may be caused by:  Wearing narrow or pointed shoes that force the big toe to press against the other toes.  Abnormal foot development that causes the foot to roll inward (pronate).  Changes in the foot that are caused by certain diseases, such as rheumatoid arthritis or polio.  A foot injury. What increases the risk? The following factors may make you more likely to develop this condition:  Wearing shoes that squeeze the toes together.  Having certain diseases, such as: ? Rheumatoid arthritis. ? Polio. ? Cerebral palsy.  Having family members who have bunions.  Being born with a foot deformity, such as flat feet or low arches.  Doing activities that put a lot of pressure on the feet, such as ballet dancing. What are the signs or symptoms? The main symptom of a bunion is a noticeable bump on the big toe. Other symptoms may include:  Pain.  Swelling around the big toe.  Redness and inflammation.  Thick or hardened skin on the big toe or between the toes.  Stiffness or loss of motion in the big toe.  Trouble with walking. How is this diagnosed? A bunion may be diagnosed based on your symptoms, medical history, and activities. You may have tests, such as:  X-rays. These allow your health care provider to check the position of the bones in your foot and look for damage to your joint. They also help your health care provider determine the severity of your bunion and the best way to treat it.  Joint aspiration. In this test, a sample of fluid is removed from the toe joint. This test may be done if you are in a lot of pain. It helps rule out diseases that cause painful swelling of the joints, such as arthritis. How is this  treated? Treatment depends on the severity of your symptoms. The goal of treatment is to relieve symptoms and prevent the bunion from getting worse. Your health care provider may recommend:  Wearing shoes that have a wide toe box.  Using bunion pads to cushion the affected area.  Taping your toes together to keep them in a normal position.  Placing a device inside your shoe (orthotics) to help reduce pressure on your toe joint.  Taking medicine to ease pain, inflammation, and swelling.  Applying heat or ice to the affected area.  Doing stretching exercises.  Surgery to remove scar tissue and move the toes back into their normal position. This treatment is rare. Follow these instructions at home: Managing pain, stiffness, and swelling   If directed, put ice on the painful area: ? Put ice in a plastic bag. ? Place a towel between your skin and the bag. ? Leave the ice on for 20 minutes, 2-3 times a day. Activity   If directed, apply heat to the affected area before you exercise. Use the heat source that your health care provider recommends, such as a moist heat pack or a heating pad. ? Place a towel between your skin and the heat source. ? Leave the heat on for 20-30 minutes. ? Remove the heat if your skin turns bright red. This is especially important if you are unable to feel pain,   heat, or cold. You may have a greater risk of getting burned.  Do exercises as told by your health care provider. General instructions  Support your toe joint with proper footwear, shoe padding, or taping as told by your health care provider.  Take over-the-counter and prescription medicines only as told by your health care provider.  Keep all follow-up visits as told by your health care provider. This is important. Contact a health care provider if your symptoms:  Get worse.  Do not improve in 2 weeks. Get help right away if you have:  Severe pain and trouble with walking. Summary  A  bunion is a bump on the base of the big toe that forms when the bones of the big toe joint move out of position.  Bunions can make walking painful.  Treatment depends on the severity of your symptoms.  Support your toe joint with proper footwear, shoe padding, or taping as told by your health care provider. This information is not intended to replace advice given to you by your health care provider. Make sure you discuss any questions you have with your health care provider. Document Released: 08/05/2005 Document Revised: 02/09/2018 Document Reviewed: 12/16/2017 Elsevier Patient Education  2020 [employer]  Pre-Operative Instructions  Congratulations, you have decided to take an important step towards improving your quality of life.  You can be assured that the doctors and staff at [facility] will be with you every step of the way.  Here are some important things you should know:  1. Plan to be at the surgery center/hospital at least 1 (one) hour prior to your scheduled time, unless otherwise directed by the surgical center/hospital staff.  You must have a responsible adult accompany you, remain during the surgery and drive you home.  Make sure you have directions to the surgical center/hospital to ensure you arrive on time. 2. If you are having surgery at [facility] or [facility], you will need a copy of your medical history and physical form from your family physician within one month prior to the date of surgery. We will give you a form for your primary physician to complete.  3. We make every effort to accommodate the date you request for surgery.  However, there are times where surgery dates or times have to be moved.  We will contact you as soon as possible if a change in schedule is required.   4. No aspirin/ibuprofen for one week before surgery.  If you are on aspirin, any non-steroidal anti-inflammatory medications (Mobic, Aleve, Ibuprofen) should not be taken seven  (7) days prior to your surgery.  You make take Tylenol for pain prior to surgery.  5. Medications - If you are taking daily heart and blood pressure medications, seizure, reflux, allergy, asthma, anxiety, pain or diabetes medications, make sure you notify the surgery center/hospital before the day of surgery so they can tell you which medications you should take or avoid the day of surgery. 6. No food or drink after midnight the night before surgery unless directed otherwise by surgical center/hospital staff. 7. No alcoholic beverages 24-hours prior to surgery.  No smoking 24-hours prior or 24-hours after surgery. 8. Wear loose pants or shorts. They should be loose enough to fit over bandages, boots, and casts. 9. Don't wear slip-on shoes. Sneakers are preferred. 10. Bring your boot with you to the surgery center/hospital.  Also bring crutches or a walker if your physician has prescribed it for you.    If you do not have this equipment, it will be provided for you after surgery. 11. If you have not been contacted by the surgery center/hospital by the day before your surgery, call to confirm the date and time of your surgery. 12. Leave-time from work may vary depending on the type of surgery you have.  Appropriate arrangements should be made prior to surgery with your employer. 13. Prescriptions will be provided immediately following surgery by your doctor.  Fill these as soon as possible after surgery and take the medication as directed. Pain medications will not be refilled on weekends and must be approved by the doctor. 14. Remove nail polish on the operative foot and avoid getting pedicures prior to surgery. 15. Wash the night before surgery.  The night before surgery wash the foot and leg well with water and the antibacterial soap provided. Be sure to pay special attention to beneath the toenails and in between the toes.  Wash for at least three (3) minutes. Rinse thoroughly with water and dry well with  a towel.  Perform this wash unless told not to do so by your physician.  Enclosed: 1 Ice pack (please put in freezer the night before surgery)   1 Hibiclens skin cleaner   Pre-op instructions  If you have any questions regarding the instructions, please do not hesitate to call our office.  [place]: [address], [place], [place] [address] -- [phone]  [place]: [address] , [place] [address] -- [phone]  [place]: [address]  [place], [place] [address] -- [phone]   Website: [email or web address] 

## 2019-06-01 ENCOUNTER — Telehealth: Payer: Self-pay | Admitting: Podiatry

## 2019-06-01 NOTE — Telephone Encounter (Signed)
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  Hospital - [facility]        In Network Copay Coinsurance Authorization Required Not Applicable 0%  per  Service Year No      Out of Network Copay Coinsurance Authorization Required Not Applicable 21%  per  Service Year No

## 2019-06-07 NOTE — Progress Notes (Signed)
Subjective:   Patient ID: Kimberly Romero, female   DOB: 38 y.o.   MRN: [id number]   HPI Patient presents stating that the right bunion is becoming increasingly tender and she had surgery on the left one and she wants to get it corrected like the right 1.  States that it has been sore in a more intense fashion over the last year.  Patient does not smoke likes to be active and has tried shoe gear modification and soaks   Review of Systems  All other systems reviewed and are negative.       Objective:  Physical Exam Vitals signs and nursing note reviewed.  Constitutional:      Appearance: She is well-developed.  Pulmonary:     Effort: Pulmonary effort is normal.  Musculoskeletal: Normal range of motion.  Skin:    General: Skin is warm.  Neurological:     Mental Status: She is alert.     Neurovascular status found to be intact muscle strength was found to be adequate range of motion within normal limits.  Patient is noted to have prominence of the right first metatarsal with inflammation around the first metatarsal head pain with palpation what appears to be nerve irritation with well-healed and good alignment of the left     Assessment:  Doing well correction of the left foot with prominent of the first metatarsal right with redness of the joint surface and pain with palpation     Plan:  H&P reviewed condition and recommended correction of right foot.  Patient wants surgery and I explained this to patient and patient already is aware because of the left and wants to do consent form today.  I reviewed with patient surgery explained the distal osteotomy and that we may not be able to get complete correction due to the anatomy of her foot and the adductus deformity.  Patient is completely aware this wants surgery understanding alternative treatments complications and signed consent form and understands total recovery can take 6 months to 1 year.  Dispensed air fracture walker today  with all instructions on usage and patient scheduled for outpatient surgery and is encouraged to call with questions concerns

## 2019-06-09 ENCOUNTER — Other Ambulatory Visit: Payer: Self-pay | Admitting: Obstetrics and Gynecology

## 2019-06-15 ENCOUNTER — Telehealth: Payer: Self-pay | Admitting: Podiatry

## 2019-06-15 NOTE — Telephone Encounter (Signed)
I was supposed to have had surgery 06/08/2019 but the surgical center said I owed them money before I could have my surgery there. I don't have the money that I owe them to pay in full right now as I work part time. Does Dr. Paulla Dolly do Kimberly Romero Bunionectomy's in the office? I asked Kimberly Romero and told her he normally doesn't but I'd schedule her to see Kimberly Romero and he could refer her to one of our other providers. Patient wants to know if Kimberly Romero could go ahead and refer her as her insurance runs out in 30 days. I told her I would see if Kimberly Romero could but I could not promise we could get her scheduled for surgery at a [facility] facility within the next 30 days.

## 2019-06-16 ENCOUNTER — Other Ambulatory Visit: Payer: Self-pay | Admitting: Obstetrics and Gynecology

## 2019-06-16 ENCOUNTER — Encounter: Payer: BC Managed Care – PPO | Admitting: Podiatry

## 2019-06-16 MED ORDER — ALPRAZOLAM 1 MG PO TABS: 1.0000 mg | ORAL_TABLET | Freq: Every evening | ORAL | 1 refills | Status: DC | PRN

## 2019-06-16 MED ORDER — ALPRAZOLAM 0.5 MG PO TABS: 0.5000 mg | ORAL_TABLET | Freq: Two times a day (BID) | ORAL | 1 refills | Status: DC | PRN

## 2019-06-16 NOTE — Telephone Encounter (Signed)
Please make this happen. Dr. Posey Pronto can see and go ahead and find her a date and get it scheduled for [place] right. Our job is to satisfy our patients needs, you don't need my permission to make this happen. Please confirm with me you made it happen

## 2019-06-16 NOTE — Telephone Encounter (Signed)
I am calling you on behalf of Dr. Paulla Dolly.  He wants to refer you to Dr. Posey Pronto to do your surgery.  "Okay, that is fine."  Dr. Posey Pronto wants to see you for a consultation so you can sign the consent forms.  "Okay, when can I see him?"  I'll transfer you to a scheduler so you can schedule that appointment.  I also want to make you aware that [facility] requires all patients to have a history and physical form completed.  You must have seen your primary care physician within a 30 day window of your surgery date.  "Okay, I'll have to call my doctor and make an appointment.  So I guess I will go ahead and make this appointment.  If I need to make any changes I will."  I'll transfer you to a scheduler so you can schedule your appointment with Dr. Posey Pronto.  I transferred her to [place].

## 2019-06-16 NOTE — Telephone Encounter (Signed)
Delydia, what is the latest with this patient? Just following up. Thanks for your help with this.

## 2019-06-23 ENCOUNTER — Other Ambulatory Visit: Payer: Self-pay | Admitting: Obstetrics and Gynecology

## 2019-06-23 DIAGNOSIS — F5101 Primary insomnia: Secondary | ICD-10-CM

## 2019-06-23 MED ORDER — QUETIAPINE FUMARATE 100 MG PO TABS: 50.0000 mg | ORAL_TABLET | Freq: Every day | ORAL | 1 refills | Status: DC

## 2019-06-23 NOTE — Telephone Encounter (Signed)
Due to pt loosing insurance soon, got pt in at 1 pm on 06/28/19 for her surgery consult with Dr. Posey Pronto. Okayed by Philis Pique.

## 2019-06-26 ENCOUNTER — Other Ambulatory Visit: Payer: Self-pay | Admitting: *Deleted

## 2019-06-26 DIAGNOSIS — Z20822 Contact with and (suspected) exposure to covid-19: Secondary | ICD-10-CM

## 2019-06-26 NOTE — Progress Notes (Signed)
Possible exposure. 

## 2019-06-28 ENCOUNTER — Ambulatory Visit: Payer: BC Managed Care – PPO | Admitting: Podiatry

## 2019-06-28 LAB — NOVEL CORONAVIRUS, NAA: SARS-CoV-2, NAA: NOT DETECTED

## 2019-06-30 ENCOUNTER — Encounter: Payer: BC Managed Care – PPO | Admitting: Podiatry

## 2019-07-07 ENCOUNTER — Ambulatory Visit: Payer: BC Managed Care – PPO | Admitting: Podiatry

## 2019-07-09 ENCOUNTER — Other Ambulatory Visit: Payer: Self-pay

## 2019-07-09 MED ORDER — HYDROCODONE-ACETAMINOPHEN 5-325 MG PO TABS: 1.0000 | ORAL_TABLET | Freq: Four times a day (QID) | ORAL | 0 refills | Status: DC | PRN

## 2019-07-20 ENCOUNTER — Other Ambulatory Visit: Payer: Self-pay

## 2019-07-28 ENCOUNTER — Other Ambulatory Visit: Payer: Self-pay

## 2019-07-30 ENCOUNTER — Encounter: Payer: BLUE CROSS/BLUE SHIELD | Admitting: Obstetrics and Gynecology

## 2019-08-06 ENCOUNTER — Other Ambulatory Visit: Payer: Self-pay

## 2019-08-06 ENCOUNTER — Encounter: Payer: Self-pay | Admitting: Certified Nurse Midwife

## 2019-08-06 ENCOUNTER — Ambulatory Visit (INDEPENDENT_AMBULATORY_CARE_PROVIDER_SITE_OTHER): Payer: BC Managed Care – PPO | Admitting: Certified Nurse Midwife

## 2019-08-06 VITALS — BP 127/80 | HR 82 | Ht 63.0 in | Wt 131.4 lb

## 2019-08-06 DIAGNOSIS — Z8639 Personal history of other endocrine, nutritional and metabolic disease: Secondary | ICD-10-CM

## 2019-08-06 DIAGNOSIS — Z1231 Encounter for screening mammogram for malignant neoplasm of breast: Secondary | ICD-10-CM | POA: Diagnosis not present

## 2019-08-06 DIAGNOSIS — R102 Pelvic and perineal pain: Secondary | ICD-10-CM | POA: Diagnosis not present

## 2019-08-06 DIAGNOSIS — Z01419 Encounter for gynecological examination (general) (routine) without abnormal findings: Secondary | ICD-10-CM | POA: Diagnosis not present

## 2019-08-06 DIAGNOSIS — G479 Sleep disorder, unspecified: Secondary | ICD-10-CM

## 2019-08-06 DIAGNOSIS — F419 Anxiety disorder, unspecified: Secondary | ICD-10-CM

## 2019-08-06 MED ORDER — ALPRAZOLAM 0.5 MG PO TABS: 0.5000 mg | ORAL_TABLET | Freq: Two times a day (BID) | ORAL | 0 refills | Status: DC | PRN

## 2019-08-06 MED ORDER — ZOLPIDEM TARTRATE ER 6.25 MG PO TBCR: 6.2500 mg | EXTENDED_RELEASE_TABLET | Freq: Every evening | ORAL | 0 refills | Status: DC | PRN

## 2019-08-06 MED ORDER — ALPRAZOLAM 1 MG PO TABS: 1.0000 mg | ORAL_TABLET | Freq: Every evening | ORAL | 0 refills | Status: DC | PRN

## 2019-08-06 NOTE — Patient Instructions (Signed)

## 2019-08-06 NOTE — Progress Notes (Signed)
GYNECOLOGY ANNUAL PREVENTATIVE CARE ENCOUNTER NOTE  History:     Kimberly Romero is a 38 y.o. G74P0 female here for a routine annual gynecologic exam.  Current complaints: Pelvic pain that occurred x 1 that was debilitating , has resolved not but has some concern about IUD placement.   Denies abnormal vaginal bleeding, discharge, pelvic pain, problems with intercourse or other gynecologic concerns.     Social History: Sexual: heterosexual Marital Status: separated Living situation: with family Occupation: unknown occupation Tobacco/alcohol: no tobacco use Illicit drugs: no history of illicit drug use  Gynecologic History No LMP recorded (lmp unknown). (Menstrual status: IUD). Contraception: IUD Last Pap: 07/28/18. Results were: normal with negative HPV Last mammogram: 02/10/19. Results were: normal  Obstetric History OB History  Gravida Para Term Preterm AB Living  2         2  SAB TAB Ectopic Multiple Live Births          2    # Outcome Date GA Lbr Len/2nd Weight Sex Delivery Anes PTL Lv  2 Gravida 2011    M CS-Unspec   LIV  1 Gravida 2006    M CS-Unspec   LIV    Past Medical History:  Diagnosis Date  . Anxiety   . Depression   . Disturbed sleep rhythm   . Enlarged thyroid   . History of hypothyroidism   . Vaginal Pap smear, abnormal    years ago    Past Surgical History:  Procedure Laterality Date  . AUGMENTATION MAMMAPLASTY Bilateral 2013  . BREAST SURGERY    . CESAREAN SECTION     X 2    Current Outpatient Medications on File Prior to Visit  Medication Sig Dispense Refill  . ALPRAZolam (XANAX) 0.5 MG tablet Take 1 tablet (0.5 mg total) by mouth 2 (two) times daily as needed. for anxiety 60 tablet 1  . ALPRAZolam (XANAX) 1 MG tablet Take 1 tablet (1 mg total) by mouth at bedtime as needed for anxiety. 60 tablet 1  . HYDROcodone-acetaminophen (NORCO/VICODIN) 5-325 MG tablet Take 1 tablet by mouth every 6 (six) hours as needed. 15 tablet 0  . ibuprofen  (ADVIL,MOTRIN) 100 MG tablet Take 100 mg by mouth as needed for fever.    Marland Kitchen levonorgestrel (MIRENA) 20 MCG/24HR IUD 1 each by Intrauterine route once.    Marland Kitchen QUEtiapine (SEROQUEL) 100 MG tablet Take 0.5 tablets (50 mg total) by mouth at bedtime. 30 tablet 1   No current facility-administered medications on file prior to visit.    Allergies  Allergen Reactions  . Sulfa Antibiotics Other (See Comments)    UNKNOWN    Social History:  reports that she has never smoked. She has never used smokeless tobacco. She reports current alcohol use. She reports that she does not use drugs.  Family History  Problem Relation Age of Onset  . Cancer Maternal Grandfather        colon  . Prostate cancer Neg Hx   . Kidney cancer Neg Hx   . Breast cancer Neg Hx     The following portions of the patient's history were reviewed and updated as appropriate: allergies, current medications, past family history, past medical history, past social history, past surgical history and problem list.  Review of Systems Pertinent items noted in HPI and remainder of comprehensive ROS otherwise negative.  Physical Exam:  BP 127/80   Pulse 82   Ht 5\' 3"  (1.6 m)   Wt 131 lb 6.4 oz (59.6  kg)   LMP  (LMP Unknown)   BMI 23.28 kg/m  CONSTITUTIONAL: Well-developed, well-nourished female in no acute distress.  HENT:  Normocephalic, atraumatic, External right and left ear normal. Oropharynx is clear and moist EYES: Conjunctivae and EOM are normal. Pupils are equal, round, and reactive to light. No scleral icterus.  NECK: Normal range of motion, supple, no masses.  Normal thyroid.  SKIN: Skin is warm and dry. No rash noted. Not diaphoretic. No erythema. No pallor. MUSCULOSKELETAL: Normal range of motion. No tenderness.  No cyanosis, clubbing, or edema.  2+ distal pulses. NEUROLOGIC: Alert and oriented to person, place, and time. Normal reflexes, muscle tone coordination.  PSYCHIATRIC: Normal mood and affect. Normal  behavior. Normal judgment and thought content. CARDIOVASCULAR: Normal heart rate noted, regular rhythm RESPIRATORY: Clear to auscultation bilaterally. Effort and breath sounds normal, no problems with respiration noted. BREASTS: Symmetric in size. No masses, tenderness, skin changes, nipple drainage, or lymphadenopathy bilaterally. ABDOMEN: Soft, no distention noted.  No tenderness, rebound or guarding.  PELVIC: Normal appearing external genitalia and urethral meatus; normal appearing vaginal mucosa and cervix.  No abnormal discharge noted.  Pap smear not indicated.  Normal uterine size, no other palpable masses, no uterine or adnexal tenderness.   Assessment and Plan:    Annual Well Women GYN Exam  Pap smear -not due Mammogram not due until 2021, order placed Labs: TSH, has history of hypothyroid han not been checked in a while Refills: xanax refilled x 1 , referral to behavioral health placed.  Routine preventative health maintenance measures emphasized. Please refer to After Visit Summary for other counseling recommendations.      Philip Aspen, CNM

## 2019-08-06 NOTE — Telephone Encounter (Signed)
Kimberly Romero, We received this request through Interface.  Thanks. [name]

## 2019-08-06 NOTE — Progress Notes (Signed)
Patient here for annual exam.  Patient c/o pelvic pain and bad cramps 1 week ago that lasted 4 days.  Patient took Ibuprofen with relief. Declines flu shot.

## 2019-08-06 NOTE — Telephone Encounter (Signed)
Patient was here today for her AE with you.  Please see request.  Thanks. Kimberly Romero

## 2019-08-07 LAB — THYROID PANEL WITH TSH
Free Thyroxine Index: 2.2 (ref 1.2–4.9)
T3 Uptake Ratio: 27 % (ref 24–39)
T4, Total: 8.1 ug/dL (ref 4.5–12.0)
TSH: 0.479 u[IU]/mL (ref 0.450–4.500)

## 2019-08-09 NOTE — Telephone Encounter (Signed)
I put in these orders when I saw her, not sure why she is requesting a refill. Can we investigate this a little. Did it go to the right pharmacy.   Thanks  Deneise Lever

## 2019-08-11 ENCOUNTER — Other Ambulatory Visit: Payer: Self-pay

## 2019-08-11 ENCOUNTER — Ambulatory Visit (INDEPENDENT_AMBULATORY_CARE_PROVIDER_SITE_OTHER): Payer: BC Managed Care – PPO

## 2019-08-11 DIAGNOSIS — R102 Pelvic and perineal pain: Secondary | ICD-10-CM | POA: Diagnosis not present

## 2019-08-11 DIAGNOSIS — Z01419 Encounter for gynecological examination (general) (routine) without abnormal findings: Secondary | ICD-10-CM | POA: Diagnosis not present

## 2019-08-18 ENCOUNTER — Other Ambulatory Visit: Payer: Self-pay | Admitting: Certified Nurse Midwife

## 2019-08-18 DIAGNOSIS — N83202 Unspecified ovarian cyst, left side: Secondary | ICD-10-CM

## 2019-08-18 NOTE — Progress Notes (Signed)
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  Philip Aspen, CNM

## 2019-09-08 ENCOUNTER — Other Ambulatory Visit: Payer: Self-pay

## 2019-10-05 ENCOUNTER — Telehealth: Payer: Self-pay

## 2019-10-05 ENCOUNTER — Other Ambulatory Visit: Payer: Self-pay

## 2019-10-05 DIAGNOSIS — F5101 Primary insomnia: Secondary | ICD-10-CM

## 2019-10-05 MED ORDER — QUETIAPINE FUMARATE 100 MG PO TABS: 50.0000 mg | ORAL_TABLET | Freq: Every day | ORAL | 2 refills | Status: DC

## 2019-10-05 NOTE — Telephone Encounter (Signed)
Fax received from [facility] in [place] for a refill on quetiapine 100mg  1/2 tab HS sent to pharmacy.

## 2019-10-26 ENCOUNTER — Other Ambulatory Visit: Payer: Self-pay

## 2019-11-10 ENCOUNTER — Other Ambulatory Visit: Payer: Self-pay | Admitting: Certified Nurse Midwife

## 2019-11-14 ENCOUNTER — Other Ambulatory Visit: Payer: Self-pay

## 2019-11-15 ENCOUNTER — Other Ambulatory Visit: Payer: Self-pay

## 2019-11-16 NOTE — Telephone Encounter (Signed)
Please advise 

## 2019-12-18 ENCOUNTER — Ambulatory Visit (INDEPENDENT_AMBULATORY_CARE_PROVIDER_SITE_OTHER)
Admission: RE | Admit: 2019-12-18 | Discharge: 2019-12-18 | Disposition: A | Discharge status: Home / Self Care | Payer: 59 | Source: Ambulatory Visit

## 2019-12-18 DIAGNOSIS — F5101 Primary insomnia: Secondary | ICD-10-CM

## 2019-12-18 DIAGNOSIS — F411 Generalized anxiety disorder: Secondary | ICD-10-CM

## 2019-12-18 MED ORDER — FLUOXETINE HCL 20 MG PO CAPS: 20.0000 mg | ORAL_CAPSULE | Freq: Every day | ORAL | 0 refills | Status: DC

## 2019-12-18 MED ORDER — ALPRAZOLAM 0.5 MG PO TABS: 0.5000 mg | ORAL_TABLET | Freq: Two times a day (BID) | ORAL | 0 refills | Status: DC | PRN

## 2019-12-18 MED ORDER — QUETIAPINE FUMARATE 100 MG PO TABS: 50.0000 mg | ORAL_TABLET | Freq: Every day | ORAL | 2 refills | Status: DC

## 2019-12-18 MED ORDER — ALPRAZOLAM 1 MG PO TABS: 1.0000 mg | ORAL_TABLET | Freq: Every evening | ORAL | 0 refills | Status: DC | PRN

## 2019-12-18 NOTE — Discharge Instructions (Addendum)
Please try and establish care with new primary care for further refills of medicines

## 2019-12-18 NOTE — ED Provider Notes (Signed)
Virtual Visit via Video Note:  Kimberly Romero  initiated request for Telemedicine visit with [facility] team. I connected with Kimberly Romero  on 12/18/2019 at 10:57 AM  for a synchronized telemedicine visit using a video enabled HIPPA compliant telemedicine application. I verified that I am speaking with Kimberly Romero  using two identifiers. Kimberly Seifert C Kaimana Neuzil, Kimberly Romero  was physically located in a [facility] Urgent care site and Kimberly Romero was located at a different location.   The limitations of evaluation and management by telemedicine as well as the availability of in-person appointments were discussed. Patient was informed that she  may incur a bill ( including co-pay) for this virtual visit encounter. Kimberly Romero  expressed understanding and gave verbal consent to proceed with virtual visit.     History of Present Illness:Kimberly Romero  is a 39 y.o. female presents for medication refill.  Patient notes that she has history of anxiety and depression and is on Xanax which she uses daily.  She is requesting refill of this.  She is currently in between PCPs as her prior PCP is no longer practicing.  She also reports poor sleep, previously was on Seroquel and reports that this was helping her significantly.  She has also previously used Ambien without significant relief.  Reports being on Xanax for years.   Allergies  Allergen Reactions  . Sulfa Antibiotics Other (See Comments)    UNKNOWN     Past Medical History:  Diagnosis Date  . Anxiety   . Depression   . Disturbed sleep rhythm   . Enlarged thyroid   . History of hypothyroidism   . Kidney stones   . Vaginal Pap smear, abnormal    years ago     Social History   Tobacco Use  . Smoking status: Never Smoker  . Smokeless tobacco: Never Used  Substance Use Topics  . Alcohol use: Yes    Comment: Occasional   . Drug use: No        Observations/Objective: Physical Exam  Constitutional:  She is oriented to person, place, and time and well-developed, well-nourished, and in no distress. No distress.  HENT:  Head: Normocephalic and atraumatic.  Pulmonary/Chest: Effort normal. No respiratory distress.  Speaking in full sentences  Neurological: She is alert and oriented to person, place, and time.  Speech clear, face symmetric     Assessment and Plan:  Generalized anxiety disorder  Primary insomnia - Plan: QUEtiapine (SEROQUEL) 100 MG tablet  Provided 2 weeks worth of Xanax, advised patient given this is a controlled substance and she is using it chronically this needs to have further refills from primary care.  Refilled Seroquel as she has previously used this with success for her insomnia.  She expresses interest in trying alternative medicine to help with sleep, advised we will proceed with Seroquel given she has used this previously, but may discuss with PCP when she gets established.  Discussed strict return precautions. Patient verbalized understanding and is agreeable with plan.  Registry was checked and she has had consistent prescriptions for Xanax either from her primary PCP or her OB/GYN.    Follow Up Instructions:     I discussed the assessment and treatment plan with the patient. The patient was provided an opportunity to ask questions and all were answered. The patient agreed with the plan and demonstrated an understanding of the instructions.   The patient was advised to call back or seek an  in-person evaluation if the symptoms worsen or if the condition fails to improve as anticipated.      Lew Dawes, Kimberly Romero  12/18/2019 10:57 AM         Lew Dawes, Kimberly Romero 12/19/19 0901

## 2020-01-03 ENCOUNTER — Other Ambulatory Visit: Payer: Self-pay

## 2020-01-03 ENCOUNTER — Encounter: Payer: Self-pay | Admitting: Nurse Practitioner

## 2020-01-03 ENCOUNTER — Telehealth (INDEPENDENT_AMBULATORY_CARE_PROVIDER_SITE_OTHER): Payer: 59 | Admitting: Nurse Practitioner

## 2020-01-03 VITALS — Temp 97.6°F | Ht 63.0 in | Wt 124.0 lb

## 2020-01-03 DIAGNOSIS — F419 Anxiety disorder, unspecified: Secondary | ICD-10-CM | POA: Diagnosis not present

## 2020-01-03 NOTE — Progress Notes (Signed)
Virtual Visit via Video Note  I connected with@ on 01/03/20 at 10:00 AM EDT by a video enabled telemedicine application and verified that I am speaking with the correct person using two identifiers. This visit type was conducted due to national recommendations for restrictions regarding the COVID-19 Pandemic (e.g. social distancing).  This format is felt to be most appropriate for this patient at this time.   Location patient: home  Location provider: work  Persons participating in the virtual visit: patient, provider    I discussed the limitations, risks, security and privacy concerns of performing an evaluation and management service by telephone and the availability of in person appointments. I also discussed with the patient that there may be a patient responsible charge related to this service. The patient expressed understanding and agreed to proceed.   History of Present Illness: Kimberly Romero is a 39 year old female patient of [facility] who presents to establish  care with a primary care provider. She needs refill of her chronic  alprazolam medication. She is an active patient of [facility] and their providers have been prescribing her alprazolam per PDMP review. Her son tested positive for Covid so she had make the establish care visit today a video appt.   Anxiety: Patient reports her anxiety started in middle school.  She had excessive worry about her mother's health and she does report anxiety symptoms today as well.  She is no longer having the panic attacks like she had.  Patient reports she is a single mom, has 1 child in home school, another child in-person school.  She works as a self-employed Scientist, research (medical). She denies specific depression symptoms.  Scores today show a GAD-7: gap of 12 and PHQ-9:  7 . No SI/HI.   Patient states she was placed on Xanax in middle school and has continued to take it "all of these years."  Patient finds that she needs Xanax 1 mg at bedtime to help her  relax and sleep.  She also has an additional prescription of 0.5 mg and  she can take it up to 2 times a day for anxiety.  She only takes of the additional 0.5 mg 1 tablet maybe 3 times a week.  She is taking her Prozac 20 mg daily.  She does get a headache once a week takes Advil. She has hydrocodone to use as needed if she gets another kidney stone.  She reports she has bilateral kidney stones but they are not moving and not causing pain.  She has not been using her hydrocodone.    She was seen in the ED 12/18/2019 reported being in b/w primary care providers and requesting refills until she can be seen here. She was given 15 of alprazolam 1 mg tabs and #30 of alprazolam 0.5 mg tabs. She was restarted on her Seroquel at bedtime. Her Ambien was prescribed 08/06/2019 #30 and not refilled. She was re started on Seroquel because she still has difficulty sleeping despite the alprazolam 1 mg nightly dose. She has not had formal sleep studies.   Chart review shows that her GYN/PCP providers recommended behavioral medicine for continued refills in 07/2019. Patient states she did not see Behavioral Medicine  because she did not have health insurance.She reports  that she now has health insurance and is willing to see a psychiatrist to help with medication management.    Past Medical History:  Diagnosis Date  . Anxiety   . Depression   . Disturbed sleep rhythm   .  Enlarged thyroid   . History of hypothyroidism   . Kidney stones   . Vaginal Pap smear, abnormal    years ago     Social History   Socioeconomic History  . Marital status: Legally Separated    Spouse name: Not on file  . Number of children: Not on file  . Years of education: Not on file  . Highest education level: Not on file  Occupational History  . Not on file  Tobacco Use  . Smoking status: Never Smoker  . Smokeless tobacco: Never Used  Substance and Sexual Activity  . Alcohol use: Yes    Comment: Occasional   . Drug use: No  .  Sexual activity: Yes    Birth control/protection: I.U.D.    Comment: Mirena  Other Topics Concern  . Not on file  Social History Narrative  . Not on file   Social Determinants of Health   Financial Resource Strain:   . Difficulty of Paying Living Expenses:   Food Insecurity:   . Worried About Programme researcher, broadcasting/film/video in the Last Year:   . Barista in the Last Year:   Transportation Needs:   . Freight forwarder (Medical):   Marland Kitchen Lack of Transportation (Non-Medical):   Physical Activity:   . Days of Exercise per Week:   . Minutes of Exercise per Session:   Stress:   . Feeling of Stress :   Social Connections:   . Frequency of Communication with Friends and Family:   . Frequency of Social Gatherings with Friends and Family:   . Attends Religious Services:   . Active Member of Clubs or Organizations:   . Attends Banker Meetings:   Marland Kitchen Marital Status:   Intimate Partner Violence:   . Fear of Current or Ex-Partner:   . Emotionally Abused:   Marland Kitchen Physically Abused:   . Sexually Abused:     Past Surgical History:  Procedure Laterality Date  . AUGMENTATION MAMMAPLASTY Bilateral 2013  . BREAST SURGERY    . CESAREAN SECTION     X 2    Family History  Problem Relation Age of Onset  . Cancer Maternal Grandfather        colon  . Prostate cancer Neg Hx   . Kidney cancer Neg Hx   . Breast cancer Neg Hx   . Ovarian cancer Neg Hx   . Colon cancer Neg Hx     Allergies  Allergen Reactions  . Sulfa Antibiotics Other (See Comments)    UNKNOWN    Current Outpatient Medications on File Prior to Visit  Medication Sig Dispense Refill  . ALPRAZolam (XANAX) 0.5 MG tablet Take 1 tablet (0.5 mg total) by mouth 2 (two) times daily as needed. for anxiety 30 tablet 0  . ALPRAZolam (XANAX) 1 MG tablet Take 1 tablet (1 mg total) by mouth at bedtime as needed for anxiety. 15 tablet 0  . FLUoxetine (PROZAC) 20 MG capsule Take 1 capsule (20 mg total) by mouth daily. 30  capsule 0  . HYDROcodone-acetaminophen (NORCO/VICODIN) 5-325 MG tablet Take 1 tablet by mouth every 6 (six) hours as needed. 15 tablet 0  . levonorgestrel (MIRENA) 20 MCG/24HR IUD 1 each by Intrauterine route once.    . naproxen (NAPROSYN) 500 MG tablet SMARTSIG:1 Tablet(s) By Mouth Every 12 Hours PRN    . QUEtiapine (SEROQUEL) 100 MG tablet Take 0.5 tablets (50 mg total) by mouth at bedtime. 30 tablet 2   No  current facility-administered medications on file prior to visit.    Temp 97.6 F (36.4 C) (Oral)   Ht 5\' 3"  (1.6 m)   Wt 124 lb (56.2 kg)   BMI 21.97 kg/m    Observations/Objective:  Gen: Awake, alert, no acute distress Resp: Breathing is even and non-labored Psych: calm/pleasant demeanor, good eye, relaxed and friendly. No pressured speech, fidgeting, or appearance of depression or anxiety.  Neuro: Alert and Oriented x 3, + facial symmetry, speech is clear.  Assessment and Plan: Anxiety/Depression:  Place a Psychiatry referral: for long standing hx of anxiety on Xanax since middle school, Prozac 20 mg, Seroquel for sleep and her scores today are still elevated  GAD-7: 12, PHQ-9: 7. She would be interested in titration off Xanax 1 mg HS nightly  and 0.5 mg prn- x3 per week and taking a different medication for anxiety. Her son has Covid- so she established care with me today via VIDEO. The PDMP was reviewed and her only pre scribers were [facility]. She should have left over alprazolam 0.5 mg if she takes it x3 per week. I will ask her how many she has in her possession.  I will have her sign a controlled substance contract with me  and refill her Xanax until Psychiatry  can take over.She was advised by her GYN/PCP to see behavior med last year- but did not get insurance until now. Consider sleep specialist referral-await Psychiatry opinion.   Time spent 24 minutes during video.   Follow Up Instructions:  I discussed the assessment and treatment plan with the patient. The  patient was provided an opportunity to ask questions and all were answered. The patient agreed with the plan and demonstrated an understanding of the instructions.   The patient was advised to call back or seek an in-person evaluation if the symptoms worsen or if the condition fails to improve as anticipated.   This visit occurred during the SARS-CoV-2 public health emergency.  Safety protocols were in place, including screening questions prior to the visit, additional usage of staff PPE, and extensive cleaning of exam room while observing appropriate contact time as indicated for disinfecting solutions.    Denice Paradise, NP

## 2020-01-04 ENCOUNTER — Telehealth: Payer: Self-pay

## 2020-01-04 NOTE — Telephone Encounter (Signed)
I would recommend a consult with a sleep specialist. Would she be in favor of a sleep study?   I cannot refill her alprazolam until she turn in the controlled substance contract.   She needs to see Psychiatry as recommended.

## 2020-01-04 NOTE — Telephone Encounter (Signed)
Spoke with patient about medication contract and how to upload copy to [place]. Patient also asking if there is something else she can take to help her sleep instead of the seroquel?

## 2020-01-05 NOTE — Telephone Encounter (Signed)
Spoke with patient about doing a sleep study; she was hesitate about this as she does not think its that kind of issue as much as it is not being able to relax enough to sleep. Patient was having a hard time printing the contract and we sent to her email by secure email. She will upload signed copy as soon as she can sign it. Patient has her psychiatry appt set for 02/22/20.

## 2020-01-10 ENCOUNTER — Telehealth: Payer: Self-pay | Admitting: Nurse Practitioner

## 2020-01-10 MED ORDER — ALPRAZOLAM 1 MG PO TABS: 1.0000 mg | ORAL_TABLET | Freq: Every evening | ORAL | 0 refills | Status: DC | PRN

## 2020-01-10 MED ORDER — ALPRAZOLAM 0.5 MG PO TABS: 0.5000 mg | ORAL_TABLET | Freq: Two times a day (BID) | ORAL | 0 refills | Status: DC | PRN

## 2020-01-10 NOTE — Telephone Encounter (Signed)
Pt brought in substance agreement. Handed to [facility].

## 2020-01-10 NOTE — Telephone Encounter (Signed)
I have refilled her Xanax doses that she presents on. She will establish care with Dr. Elna Breslow 02/22/2020. Her monthly contract is singed.

## 2020-01-10 NOTE — Telephone Encounter (Signed)
Medication contract given to Fransico Setters, NP for medication to be filled.

## 2020-01-10 NOTE — Telephone Encounter (Signed)
Both of patients rxs were called into [facility]

## 2020-01-20 ENCOUNTER — Telehealth: Payer: Self-pay | Admitting: Nurse Practitioner

## 2020-01-20 MED ORDER — FLUOXETINE HCL 20 MG PO CAPS: 20.0000 mg | ORAL_CAPSULE | Freq: Every day | ORAL | 0 refills | Status: DC

## 2020-01-20 NOTE — Telephone Encounter (Signed)
Pt needs a refill on FLUoxetine (PROZAC) 20 MG capsule sent to [facility]  

## 2020-01-26 ENCOUNTER — Other Ambulatory Visit: Payer: Self-pay | Admitting: Nurse Practitioner

## 2020-01-26 ENCOUNTER — Telehealth: Payer: Self-pay | Admitting: Nurse Practitioner

## 2020-01-26 DIAGNOSIS — F5101 Primary insomnia: Secondary | ICD-10-CM

## 2020-01-26 MED ORDER — QUETIAPINE FUMARATE 100 MG PO TABS: 50.0000 mg | ORAL_TABLET | Freq: Every day | ORAL | 2 refills | Status: DC

## 2020-01-26 NOTE — Telephone Encounter (Signed)
Pt called back stating that the wording on the prescription needs to read take whole on at night so insurance will pay for it

## 2020-01-26 NOTE — Telephone Encounter (Signed)
I am not comfortable raising her dose to 100 mg nightly. She will have to pay for it to cut it in half . Or, can I order a 50 mg tablet instead?

## 2020-01-26 NOTE — Telephone Encounter (Signed)
She has been prescribed  Seroquel 50 mg nightly. Those are the records that we received.  When does she see Dr. Elna Breslow?  Psychiatry needs to change her dose.

## 2020-01-26 NOTE — Telephone Encounter (Signed)
She sees Dr. Elna Breslow 02/22/20

## 2020-01-26 NOTE — Telephone Encounter (Signed)
LMTCB

## 2020-01-26 NOTE — Telephone Encounter (Signed)
Pt called in stated the she need refill on QUEtiapine (SEROQUEL) 100 MG tablet she had been taking wrong dosage was suppose to take 1/2 but was taking the whole pill.

## 2020-01-26 NOTE — Telephone Encounter (Addendum)
Patient is okay with having a new prescription for 50 mg tabs sent to pharmacy to take one a day.  PLAN: Please call her and let her know that I spoke with her pharmacist and she has enough Seroquel 100 mg tabs at 1/2 tab nightly to last until end of June.   She will then begin 50 mg one whole tablet nightly. This new RX will be active end of June.

## 2020-01-27 ENCOUNTER — Other Ambulatory Visit: Payer: Self-pay | Admitting: Nurse Practitioner

## 2020-01-27 DIAGNOSIS — F5101 Primary insomnia: Secondary | ICD-10-CM

## 2020-01-27 MED ORDER — QUETIAPINE FUMARATE 50 MG PO TABS
50.0000 mg | ORAL_TABLET | Freq: Every day | ORAL | 2 refills | Status: DC
Start: 2020-01-27 — End: 2020-03-28

## 2020-01-27 NOTE — Telephone Encounter (Signed)
LMOM of below

## 2020-01-27 NOTE — Progress Notes (Signed)
I will discontinue her 100 mg dose of Seroquel in favor of ordering the Seroquel 50 mg at HS.  Contact her Pharm at [facility] in [place] to notify them as the 100 mg dose is still available to her. [phone]/

## 2020-02-03 ENCOUNTER — Other Ambulatory Visit: Payer: Self-pay | Admitting: Nurse Practitioner

## 2020-02-04 ENCOUNTER — Encounter: Payer: Self-pay | Admitting: Nurse Practitioner

## 2020-02-04 NOTE — Telephone Encounter (Signed)
The patient requested refills of her Xanax 1 mg at bedtime #15.  She has an upcoming appointment with psychiatry next month.    Review of the [place] controlled substance registry was performed in accordance of the [facility] medical board prior to prescribing controlled medication.  The prescription was refilled.

## 2020-02-04 NOTE — Telephone Encounter (Signed)
Refill request for xanax, last seen 01-27-20, last filled 01-10-20.  Please advise.

## 2020-02-08 ENCOUNTER — Telehealth: Payer: Self-pay | Admitting: Nurse Practitioner

## 2020-02-08 NOTE — Telephone Encounter (Signed)
I did this already.

## 2020-02-08 NOTE — Telephone Encounter (Signed)
Pt called in need refill on ALPRAZolam (XANAX) 1 MG tablet

## 2020-02-10 ENCOUNTER — Other Ambulatory Visit: Payer: Self-pay | Admitting: Nurse Practitioner

## 2020-02-10 ENCOUNTER — Other Ambulatory Visit: Payer: Self-pay | Admitting: Certified Nurse Midwife

## 2020-02-10 DIAGNOSIS — F5101 Primary insomnia: Secondary | ICD-10-CM

## 2020-02-11 ENCOUNTER — Ambulatory Visit (INDEPENDENT_AMBULATORY_CARE_PROVIDER_SITE_OTHER)
Admission: RE | Admit: 2020-02-11 | Discharge: 2020-02-11 | Disposition: A | Discharge status: Home / Self Care | Payer: 59 | Source: Ambulatory Visit

## 2020-02-11 DIAGNOSIS — J0110 Acute frontal sinusitis, unspecified: Secondary | ICD-10-CM | POA: Diagnosis not present

## 2020-02-11 MED ORDER — FLUTICASONE PROPIONATE 50 MCG/ACT NA SUSP
1.0000 | Freq: Every day | NASAL | 2 refills | Status: DC
Start: 2020-02-11 — End: 2020-09-16

## 2020-02-11 MED ORDER — AMOXICILLIN-POT CLAVULANATE 875-125 MG PO TABS
1.0000 | ORAL_TABLET | Freq: Two times a day (BID) | ORAL | 0 refills | Status: DC
Start: 2020-02-11 — End: 2020-09-17

## 2020-02-11 MED ORDER — BENZONATATE 100 MG PO CAPS
100.0000 mg | ORAL_CAPSULE | Freq: Three times a day (TID) | ORAL | 0 refills | Status: DC
Start: 2020-02-11 — End: 2020-09-16

## 2020-02-11 MED ORDER — CETIRIZINE HCL 10 MG PO TABS
10.0000 mg | ORAL_TABLET | Freq: Every day | ORAL | 0 refills | Status: DC
Start: 2020-02-11 — End: 2020-10-03

## 2020-02-11 NOTE — ED Provider Notes (Signed)
Virtual Visit via Video Note:  Kimberly Romero  initiated request for Telemedicine visit with [facility] team. I connected with Kimberly Romero  on 02/12/2020 at 12:05 PM  for a synchronized telemedicine visit using a video enabled HIPPA compliant telemedicine application. I verified that I am speaking with Kimberly Romero  using two identifiers. Janace Aris, NP  was physically located in a [facility] Urgent care site and Kimberly Romero was located at a different location.   The limitations of evaluation and management by telemedicine as well as the availability of in-person appointments were discussed. Patient was informed that she  may incur a bill ( including co-pay) for this virtual visit encounter. Kimberly Romero  expressed understanding and gave verbal consent to proceed with virtual visit.     History of Present Illness:Kimberly Romero  is a 39 y.o. female presents with sinus pressure, mucous, green mucous. Symptoms worsening over the past 48 hours.  Reporting thick mucous and  cough. Denies sore throat, ear pain. No concern for covid. No fever, body aches, chills. Took sudafed without much relief. Takes allergy meds as needed. Used afrin.  Past Medical History:  Diagnosis Date  . Anxiety   . Depression   . Disturbed sleep rhythm   . Enlarged thyroid   . History of hypothyroidism   . Kidney stones   . Vaginal Pap smear, abnormal    years ago    Allergies  Allergen Reactions  . Sulfa Antibiotics Other (See Comments)    UNKNOWN        Observations/Objective:VITALS: Per patient if applicable, see vitals. GENERAL: Alert, appears well and in no acute distress. HEENT: Atraumatic, conjunctiva clear, no obvious abnormalities on inspection of external nose and ears. NECK: Normal movements of the head and neck. CARDIOPULMONARY: No increased WOB. Speaking in clear sentences. I:E ratio WNL.  MS: Moves all visible extremities without noticeable  abnormality. PSYCH: Pleasant and cooperative, well-groomed. Speech normal rate and rhythm. Affect is appropriate. Insight and judgement are appropriate. Attention is focused, linear, and appropriate.  NEURO: CN grossly intact. Oriented as arrived to appointment on time with no prompting. Moves both UE equally.  SKIN: No obvious lesions, wounds, erythema, or cyanosis noted on face or hands.     Assessment and Plan: treating for sinusitis medicine as prescribed.     Follow Up Instructions:Follow up as needed for continued or worsening symptoms     I discussed the assessment and treatment plan with the patient. The patient was provided an opportunity to ask questions and all were answered. The patient agreed with the plan and demonstrated an understanding of the instructions.   The patient was advised to call back or seek an in-person evaluation if the symptoms worsen or if the condition fails to improve as anticipated.    Janace Aris, NP  02/12/2020 12:05 PM         Janace Aris, NP 02/12/20 1205

## 2020-02-14 ENCOUNTER — Other Ambulatory Visit: Payer: Self-pay | Admitting: Nurse Practitioner

## 2020-02-14 MED ORDER — ALPRAZOLAM 0.5 MG PO TABS: 0.5000 mg | ORAL_TABLET | Freq: Two times a day (BID) | ORAL | 0 refills | Status: DC | PRN

## 2020-02-14 MED ORDER — ALPRAZOLAM 1 MG PO TABS
1.0000 mg | ORAL_TABLET | Freq: Every evening | ORAL | 0 refills | Status: DC | PRN
Start: 2020-02-14 — End: 2020-03-13

## 2020-02-14 NOTE — Telephone Encounter (Signed)
Yes, I sent it and I spoke to [place]. She is aware. Thank you.

## 2020-02-14 NOTE — Progress Notes (Signed)
Patient  did not fill the 02/04/2020 alprazolam 1 mg refill. That order was  cancelled. It was not in the PDMP review and I spoke to her pharmacist to confirm.   I spoke to her today.  She reports she is out of her 0.5 mg 1 pill twice a day as needed as well as her 1 mg at bedtime. She reports no seizure or withdrawal symptoms. She is not sleeping well at night. She sees psychiatry to establish care 02/22/2020 and the expectation they will continue with her psychiatric medication refills including refilling her alprazolam.   Patient advised to have  follow-up office visit follow-up in Aug/September with me per her convenience. She voices agreement. Follow-up as needed meantime.Her son had Covid -mild case. She was advised to get the Covid vaccine.

## 2020-02-14 NOTE — Telephone Encounter (Signed)
Pt called to get a refill on ALPRAZolam Prudy Feeler) 1 MG tablet she said that she got the refill on 6/18 for 15 pills but is out because she sometimes takes more than one at night

## 2020-02-14 NOTE — Telephone Encounter (Signed)
Pt called back to see if refilled had been sent

## 2020-02-21 ENCOUNTER — Other Ambulatory Visit: Payer: Self-pay | Admitting: Nurse Practitioner

## 2020-02-22 ENCOUNTER — Telehealth (INDEPENDENT_AMBULATORY_CARE_PROVIDER_SITE_OTHER): Payer: 59 | Admitting: Psychiatry

## 2020-02-22 ENCOUNTER — Other Ambulatory Visit: Payer: Self-pay

## 2020-02-22 DIAGNOSIS — Z5329 Procedure and treatment not carried out because of patient's decision for other reasons: Secondary | ICD-10-CM

## 2020-02-22 NOTE — Progress Notes (Signed)
No response to call or text or video invite. Call could not be completed when attempted to call her phone number as listed. However sent invite through Text message as well email ID provided.

## 2020-02-28 ENCOUNTER — Telehealth: Payer: Self-pay | Admitting: Nurse Practitioner

## 2020-02-28 NOTE — Telephone Encounter (Signed)
Please advise her to reschedule her Psychiatry appt- she was a no show for her 02/22/2020 appointment. We have discussed that the expectation is psychiatry  will continue with her psychiatric medication refills including refilling her alprazolam.

## 2020-02-29 NOTE — Telephone Encounter (Signed)
Spoke with patient this morning and she forgot about her appt and she will call them today to get that rescheduled

## 2020-03-10 ENCOUNTER — Other Ambulatory Visit: Payer: Self-pay | Admitting: Nurse Practitioner

## 2020-03-10 NOTE — Telephone Encounter (Signed)
Pt would like a refill on ALPRAZolam (XANAX) 1 MG tablet. She can not get an appt with mental health until Sept.

## 2020-03-10 NOTE — Telephone Encounter (Signed)
Pt needs refill on ALPRAZolam (XANAX) 1 MG tablet

## 2020-03-10 NOTE — Telephone Encounter (Signed)
Patient appt is on 05/01/20 with Dr. Elna Breslow. Are you going to refill her medication?

## 2020-03-10 NOTE — Telephone Encounter (Signed)
  1. Who does she have an appt with in psychiatry? Confirm the date and time with their office. She was a no show for the last appt that I made for her.   2. In the meantime, she needs to call [facility] for an appt- [phone] and she can see a Psychiatrist sooner to get established. Let me know when that appt is please.

## 2020-03-13 MED ORDER — ALPRAZOLAM 1 MG PO TABS: 1.0000 mg | ORAL_TABLET | Freq: Every evening | ORAL | 0 refills | Status: DC | PRN

## 2020-03-13 NOTE — Telephone Encounter (Signed)
Pt is following up on getting refill for Xanax and she is now completely out.

## 2020-03-13 NOTE — Telephone Encounter (Addendum)
I spoke with [place] today. She is asking for Xanax 1 mg that she takes every night for bedtime.  The additional Xanax 0.5 mg is taken as needed about 1-2 per week- not daily. She uses the ) 5 Chad Progression Recent Vital Signs   @VS @   Past Medical History:  Diagnosis Date  . Anxiety   . Depression   . Disturbed sleep rhythm   . Enlarged thyroid   . History of hypothyroidism   . Kidney stones   . Vaginal Pap smear, abnormal    years ago     Expected Discharge Date  @FLOW  Diet Order    None       VTE Documentation  @FLOW   Work Intensity Score/Level of Care  @FLOW (10536::1)@  @LEVELOFCARE @   Mobility  @FLOW (7060220::1)@     Significant Events    DC Barriers   Abnormal Labs:  (829562::1)@ 03/13/2020, 5:39 PM mg   She missed her July appt to establish care with Psychiatry. She rescheduled for Sept 13. 2021 with first appt with Dr. .   PLAN:  I checked the PDMP review and she is only getting her medication from as ordered. I will refill her Xanax to reflect what she needs until she gets in with psychiatry.  I will also try to get her a sooner appt with psychiatry for her medication management. She is in agreement with this plan.

## 2020-03-20 ENCOUNTER — Other Ambulatory Visit: Payer: Self-pay | Admitting: Nurse Practitioner

## 2020-03-21 ENCOUNTER — Other Ambulatory Visit: Payer: Self-pay | Admitting: Nurse Practitioner

## 2020-03-28 ENCOUNTER — Other Ambulatory Visit: Payer: Self-pay | Admitting: Nurse Practitioner

## 2020-04-03 ENCOUNTER — Telehealth: Payer: Self-pay | Admitting: Nurse Practitioner

## 2020-04-03 NOTE — Telephone Encounter (Signed)
Pt needs refill on Xanax 1mg . She said she is down to 2 pills.  Pt also said she is scheduled for Sept for her psychriast appointment but 06-07-1979 was supposed to be getting her in sooner with someone else? She is following up on this as well.

## 2020-04-04 MED ORDER — ALPRAZOLAM 1 MG PO TABS: 1.0000 mg | ORAL_TABLET | Freq: Every evening | ORAL | 0 refills | Status: AC | PRN

## 2020-04-04 MED ORDER — ALPRAZOLAM 0.5 MG PO TABS: 0.5000 mg | ORAL_TABLET | Freq: Two times a day (BID) | ORAL | 0 refills | Status: DC | PRN

## 2020-04-04 NOTE — Telephone Encounter (Signed)
Pt called back stated that she is out of herALPRAZolam Prudy Feeler) 1 MG tablet Need a refill

## 2020-04-04 NOTE — Telephone Encounter (Signed)
I spoke to Kimberly Romero today.  She is doing well overall. She sounded like her normal self.  She has been on a stable dose of alprazolam 1 mg at bedtime. She does get occasional day time panic attacks and needs more of the  0.5 mg to have on hand up to twice a day as needed- not needing it daily. She continues to take fluoxetine 20 mg daily.  She says she is doing well on this therapy.  She does plan to establish care with  psychiatry appointment coming up soon.   I checked her [place] PDMP and there is no suspicious activity.  Her last refills were the ones I gave her on 03/13/2020.  I refilled her alprazolam 1 mg tablet to take one tablet at bedtime #30.  I refilled her alprazolam 0.5 mg to take 1 tablet twice daily as needed #15.

## 2020-04-04 NOTE — Addendum Note (Signed)
Addended by: Amedeo Kinsman A on: 04/04/2020 11:19 AM   Modules accepted: Orders

## 2020-04-19 ENCOUNTER — Telehealth: Payer: Self-pay | Admitting: Nurse Practitioner

## 2020-04-19 MED ORDER — QUETIAPINE FUMARATE 50 MG PO TABS: 50.0000 mg | ORAL_TABLET | Freq: Every day | ORAL | 2 refills | Status: DC

## 2020-04-19 NOTE — Telephone Encounter (Signed)
Patient called in need refill on QUEtiapine (SEROQUEL) 50 MG tablet she is going out of town this weekend and she will be out before she gets back

## 2020-05-01 ENCOUNTER — Other Ambulatory Visit: Payer: Self-pay

## 2020-05-01 ENCOUNTER — Telehealth (INDEPENDENT_AMBULATORY_CARE_PROVIDER_SITE_OTHER): Payer: 59 | Admitting: Psychiatry

## 2020-05-01 ENCOUNTER — Encounter: Payer: Self-pay | Admitting: Psychiatry

## 2020-05-01 DIAGNOSIS — F411 Generalized anxiety disorder: Secondary | ICD-10-CM

## 2020-05-01 DIAGNOSIS — Z9189 Other specified personal risk factors, not elsewhere classified: Secondary | ICD-10-CM

## 2020-05-01 DIAGNOSIS — F5105 Insomnia due to other mental disorder: Secondary | ICD-10-CM | POA: Insufficient documentation

## 2020-05-01 MED ORDER — FLUOXETINE HCL 40 MG PO CAPS: 40.0000 mg | ORAL_CAPSULE | Freq: Every day | ORAL | 1 refills | Status: DC

## 2020-05-01 MED ORDER — QUETIAPINE FUMARATE 50 MG PO TABS: 75.0000 mg | ORAL_TABLET | Freq: Every day | ORAL | 1 refills | Status: DC

## 2020-05-01 MED ORDER — HYDROXYZINE HCL 50 MG PO TABS: 50.0000 mg | ORAL_TABLET | Freq: Every evening | ORAL | 1 refills | Status: DC | PRN

## 2020-05-01 NOTE — Progress Notes (Signed)
Provider Location : [facility] Patient Location : Work  Participants: Patient , Provider  Virtual Visit via Video Note  I connected with Kimberly Romero on 05/01/20 at  3:00 PM EDT by a video enabled telemedicine application and verified that I am speaking with the correct person using two identifiers.   I discussed the limitations of evaluation and management by telemedicine and the availability of in person appointments. The patient expressed understanding and agreed to proceed.    I discussed the assessment and treatment plan with the patient. The patient was provided an opportunity to ask questions and all were answered. The patient agreed with the plan and demonstrated an understanding of the instructions.   The patient was advised to call back or seek an in-person evaluation if the symptoms worsen or if the condition fails to improve as anticipated.  Video connection was lost at less than 50% of the duration of the visit, at which time the remainder of the visit was completed through audio only   Psychiatric Initial Adult Assessment   Patient Identification: Kimberly Romero MRN:  [id number] Date of Evaluation:  05/01/2020 Referral Source: Kelvin Cellar NP  Chief Complaint:   Chief Complaint    Establish Care     Visit Diagnosis:    ICD-10-CM   1. GAD (generalized anxiety disorder)  F41.1 FLUoxetine (PROZAC) 40 MG capsule    QUEtiapine (SEROQUEL) 50 MG tablet    hydrOXYzine (ATARAX/VISTARIL) 50 MG tablet  2. Insomnia due to mental condition  F51.05 QUEtiapine (SEROQUEL) 50 MG tablet    hydrOXYzine (ATARAX/VISTARIL) 50 MG tablet  3. At risk for long QT syndrome  Z91.89 EKG 12-Lead    History of Present Illness:  Kimberly Romero is a 39 year old female, divorced, employed, lives in [place], has a history of anxiety, sleep problems, history of renal stones was evaluated by telemedicine today.  A video call was attempted however due to connection problem it had to be  changed to a phone call during the session.  Patient reports she has been struggling with anxiety all her life.  Her anxiety may have started in middle school.  She reports she was under the care of her gynecologist at [facility] previously who had started her on medications like Prozac, Xanax and Seroquel.  She reports after her provider left the practice she was unable to get her prescriptions and had to wait until she was able to establish care with another provider.  She reports overall she has been on these medications for the past 2 years.  She however reports she ran out of Prozac 2 weeks ago and has not been taking it.  She continues to take Seroquel.  She takes her Seroquel 50 mg at bedtime for her sleep and anxiety.  She also reports taking Xanax, currently takes half tablet at bedtime as well as 1/2 tablet during the day as needed for anxiety.  She reports her anxiety symptoms as worrying about different things, being restless, shaky, having racing thoughts which affects her concentration as well as sleep at night.  She reports she has several psychosocial stressors including her older son who is 32 years old being in quarantine since he was exposed to COVID-19.  She reports the COVID-19 testing came back negative however he continues to need to be in quarantine for the next 1 week or more.  She hence reports she is constantly worried about him and also the rest of the family.  She also reports she is worried  about her job since due to the COVID-19 she has lost a few clients.  She works as a Scientist, research (medical).  She also reports extreme panic attacks at least couple of times a week when she has shortness of breath and feels restless fidgety and is unable to stay still.  She reports she takes her Xanax 1-2 times a week during the day which helps with the same.  Patient denies any depressive symptoms at this time.  She reports appetite is fair.  Patient denies any suicidality or homicidality.  She denies  any perceptual disturbances.  Patient denies any history of trauma.  Patient denies any manic or hypomanic symptoms.  Patient denies any substance abuse problems.      Associated Signs/Symptoms: Depression Symptoms:  anxiety, disturbed sleep, (Hypo) Manic Symptoms:  Denies Anxiety Symptoms:  Excessive Worry, Panic Symptoms, Psychotic Symptoms:  Denies PTSD Symptoms: Negative  Past Psychiatric History: Patient was initially prescribed psychotropic medication by her gy[facility]ologist at [facility].  Patient was recently started on her medications by her primary care provider.  Patient denies any inpatient mental health admissions.  Patient denies any suicide attempts.  Previous Psychotropic Medications: Yes Seroquel, fluoxetine, Xanax  Substance Abuse History in the last 12 months:  No.  Consequences of Substance Abuse: Negative  Past Medical History:  Past Medical History:  Diagnosis Date  . Anxiety   . Depression   . Disturbed sleep rhythm   . Enlarged thyroid   . History of hypothyroidism   . Kidney stones   . Vaginal Pap smear, abnormal    years ago    Past Surgical History:  Procedure Laterality Date  . AUGMENTATION MAMMAPLASTY Bilateral 2013  . BREAST SURGERY    . CESAREAN SECTION     X 2    Family Psychiatric History: Denies any history of mental health problems in her family  Family History:  Family History  Problem Relation Age of Onset  . Cancer Maternal Grandfather        colon  . Prostate cancer Neg Hx   . Kidney cancer Neg Hx   . Breast cancer Neg Hx   . Ovarian cancer Neg Hx   . Colon cancer Neg Hx   . Mental illness Neg Hx     Social History:   Social History   Socioeconomic History  . Marital status: Divorced    Spouse name: Not on file  . Number of children: Not on file  . Years of education: Not on file  . Highest education level: Not on file  Occupational History  . Not on file  Tobacco Use  . Smoking status: Never Smoker  .  Smokeless tobacco: Never Used  Vaping Use  . Vaping Use: Never used  Substance and Sexual Activity  . Alcohol use: Yes    Comment: Occasional   . Drug use: No  . Sexual activity: Yes    Birth control/protection: I.U.D.    Comment: Mirena  Other Topics Concern  . Not on file  Social History Narrative  . Not on file   Social Determinants of Health   Financial Resource Strain:   . Difficulty of Paying Living Expenses: Not on file  Food Insecurity:   . Worried About Programme researcher, broadcasting/film/video in the Last Year: Not on file  . Ran Out of Food in the Last Year: Not on file  Transportation Needs:   . Lack of Transportation (Medical): Not on file  . Lack of Transportation (Non-Medical): Not on file  Physical Activity:   . Days of Exercise per Week: Not on file  . Minutes of Exercise per Session: Not on file  Stress:   . Feeling of Stress : Not on file  Social Connections:   . Frequency of Communication with Friends and Family: Not on file  . Frequency of Social Gatherings with Friends and Family: Not on file  . Attends Religious Services: Not on file  . Active Member of Clubs or Organizations: Not on file  . Attends BankerClub or Organization Meetings: Not on file  . Marital Status: Not on file    Additional Social History: Patient reports she is currently employed as a Scientist, research (medical)hairstylist.  She lives in [place].  She is divorced.  She however reports she and her ex-husband are good friends.  They have shared custody of the children who are 3210 and 41 year old boys.  She reports when her children are not staying with her she usually stays with her boyfriend.  She reports a good relationship with her boyfriend.  She graduated high school, completed cosmetology school.  Patient denies any history of trauma.  Allergies:   Allergies  Allergen Reactions  . Sulfa Antibiotics Other (See Comments)    UNKNOWN    Metabolic Disorder Labs: Lab Results  Component Value Date   HGBA1C 5.3 07/28/2018   No  results found for: PROLACTIN Lab Results  Component Value Date   CHOL 147 07/28/2018   TRIG 38 07/28/2018   HDL 72 07/28/2018   CHOLHDL 2.0 07/28/2018   LDLCALC 67 07/28/2018   LDLCALC 84 05/23/2016   Lab Results  Component Value Date   TSH 0.479 08/06/2019    Therapeutic Level Labs: No results found for: LITHIUM No results found for: CBMZ No results found for: VALPROATE  Current Medications: Current Outpatient Medications  Medication Sig Dispense Refill  . ALPRAZolam (XANAX) 0.5 MG tablet Take 1 tablet (0.5 mg total) by mouth 2 (two) times daily as needed. for anxiety (Patient taking differently: Take 0.5 mg by mouth 2 (two) times daily as needed. for anxiety - 1-2 times as needed per week only) 15 tablet 0  . ALPRAZolam (XANAX) 1 MG tablet Take 1 tablet (1 mg total) by mouth at bedtime as needed for anxiety. 30 tablet 0  . fluticasone (FLONASE) 50 MCG/ACT nasal spray Place 1 spray into both nostrils daily. 16 g 2  . levonorgestrel (MIRENA) 20 MCG/24HR IUD 1 each by Intrauterine route once.    Marland Kitchen. QUEtiapine (SEROQUEL) 50 MG tablet Take 1.5 tablets (75 mg total) by mouth at bedtime. 45 tablet 1  . amoxicillin-clavulanate (AUGMENTIN) 875-125 MG tablet Take 1 tablet by mouth every 12 (twelve) hours. (Patient not taking: Reported on 05/01/2020) 14 tablet 0  . benzonatate (TESSALON) 100 MG capsule Take 1 capsule (100 mg total) by mouth every 8 (eight) hours. (Patient not taking: Reported on 05/01/2020) 21 capsule 0  . cetirizine (ZYRTEC) 10 MG tablet Take 1 tablet (10 mg total) by mouth daily. (Patient not taking: Reported on 05/01/2020) 30 tablet 0  . FLUoxetine (PROZAC) 40 MG capsule Take 1 capsule (40 mg total) by mouth daily with breakfast. 30 capsule 1  . HYDROcodone-acetaminophen (NORCO/VICODIN) 5-325 MG tablet Take 1 tablet by mouth every 6 (six) hours as needed. (Patient not taking: Reported on 05/01/2020) 15 tablet 0  . hydrOXYzine (ATARAX/VISTARIL) 50 MG tablet Take 1 tablet (50 mg  total) by mouth at bedtime as needed. 45 tablet 1  . naproxen (NAPROSYN) 500 MG tablet SMARTSIG:1 Tablet(s)  By Mouth Every 12 Hours PRN (Patient not taking: Reported on 05/01/2020)     No current facility-administered medications for this visit.    Musculoskeletal: Strength & Muscle Tone: UTA Gait & Station: UTA Patient leans: N/A  Psychiatric Specialty Exam: Review of Systems  Psychiatric/Behavioral: Positive for sleep disturbance. The patient is nervous/anxious.   All other systems reviewed and are negative.   There were no vitals taken for this visit.There is no height or weight on file to calculate BMI.  General Appearance: UTA  Eye Contact:  UTA  Speech:  Clear and Coherent  Volume:  Normal  Mood:  Anxious  Affect:  UTA  Thought Process:  Goal Directed and Descriptions of Associations: Intact  Orientation:  Full (Time, Place, and Person)  Thought Content:  Logical  Suicidal Thoughts:  No  Homicidal Thoughts:  No  Memory:  Immediate;   Fair Recent;   Fair Remote;   Fair  Judgement:  Fair  Insight:  Fair  Psychomotor Activity:  UTA  Concentration:  Concentration: Fair and Attention Span: Fair  Recall:  [employer] of Knowledge:Fair  Language: Fair  Akathisia:  No  Handed:  Right  AIMS (if indicated): UTA  Assets:  Communication Skills Desire for Improvement Housing Social Support  ADL's:  Intact  Cognition: WNL  Sleep:  Poor   Screenings: GAD-7     Video Visit from 05/01/2020 in [facility] Video Visit from 01/03/2020 in [facility]  Total GAD-7 Score 12 12    PHQ2-9     Video Visit from 01/03/2020 in [facility]  PHQ-2 Total Score 2  PHQ-9 Total Score 7      Assessment and Plan: Kimberly Romero is a 39 year old Caucasian female, divorced, lives in [place], employed, has a history of anxiety, insomnia, renal stones, was evaluated by telemedicine today.  Patient with psychosocial  stressors of the current pandemic, her children being exposed to COVID-19 and being in quarantine, financial stressors due to COVID-19 pandemic.  Patient however reports good social support system from her boyfriend.  She is motivated to start treatment as well as psychotherapy sessions.  Plan GAD-unstable GAD 7 equals 12 Increase Prozac to 40 mg p.o. daily Increase Seroquel to 75 mg p.o. nightly. Discussed with patient to taper off Xanax at bedtime.  She reports she currently takes 0.5 mg at bedtime.  Discussed with patient to cut back on the Xanax 1-2 times a week for the next 3 to 4 weeks and stop taking it. She can continue the Xanax 0.5 mg 1-2 times a week as needed for severe panic attacks I have reviewed [place] controlled substance database. Provided education about the long-term risk of benzodiazepine therapy.   Insomnia-unstable Increase Seroquel to 75 mg p.o. nightly Add hydroxyzine 50 to 75 mg p.o. nightly as needed Wean of Xanax at bedtime  I have reviewed the following labs-TSH-08/06/2019-within normal limits.  I have also reviewed lipid panel, hemoglobin A1c-07/28/2018-within normal limits.  At risk for QT syndrome-patient will benefit from EKG.  She will talk to her primary care provider.  Will refer patient for psychotherapy session with therapist at our practice.  Follow-up in clinic in 3 to 4 weeks or sooner if needed.   I have spent atleast 45 minutes  face to face with patient today. More than 50 % of the time was spent for preparing to see the patient ( e.g., review of test, records ), obtaining and to review and  separately obtained history , ordering medications and test ,psychoeducation and supportive psychotherapy and care coordination,as well as documenting clinical information in electronic health record. This note was generated in part or whole with voice recognition software. Voice recognition is usually quite accurate but there are transcription errors that can  and very often do occur. I apologize for any typographical errors that were not detected and corrected.       Jomarie Longs, MD 9/13/20213:51 PM

## 2020-05-02 ENCOUNTER — Telehealth: Payer: Self-pay | Admitting: Nurse Practitioner

## 2020-05-02 NOTE — Telephone Encounter (Signed)
EKG's can only be completed in a face to face visit with provider or if patient cannot pass covid screening she will need to have EKG performed aty UC or we need to make special arrangements for patient to be seen in office. Is there reason to believ p[atient will not pass Covid screening. EKGs cannot be a nurse visit.

## 2020-05-02 NOTE — Telephone Encounter (Signed)
She had been exposed- but not sure now. Can you forward this to front desk to set up an appt with me?

## 2020-05-02 NOTE — Telephone Encounter (Signed)
Tried to call patient; phone rang a couple of times then said call can not be completed at this time. Will try again tomorrow

## 2020-05-02 NOTE — Telephone Encounter (Signed)
She needs an EKG to check QT interval because of mental health meds. She will need to pass Covid screening. If she does not pass, how can I get an EKG done on her?

## 2020-05-03 NOTE — Telephone Encounter (Signed)
Tried to call patient; phone rang a couple of times then said call can not be completed at this time. Will try again tomorrow 

## 2020-05-04 NOTE — Telephone Encounter (Signed)
Tried to call patient; phone rang a couple of times then said call can not be completed at this time. Letter sent to patient to call the office back

## 2020-05-19 ENCOUNTER — Telehealth: Payer: Self-pay | Admitting: Nurse Practitioner

## 2020-05-19 NOTE — Telephone Encounter (Signed)
Notified patient of below message. 

## 2020-05-19 NOTE — Telephone Encounter (Signed)
Sent patient mychart message back to please contact Dr. Elna Breslow for refills

## 2020-05-19 NOTE — Telephone Encounter (Signed)
Please call Kimberly Romero and let her know Dr. Elna Breslow will need to refill her Xanax going forward. She will manage  all of the mental  health medication.

## 2020-05-19 NOTE — Telephone Encounter (Signed)
Dr. Elna Breslow will have someone on call. Please call the psychiatrist on call. Only one provider at a time can prescribe controlled substances.

## 2020-05-19 NOTE — Telephone Encounter (Signed)
Patient states she tried to contact Dr. Elna Breslow office for refills but they close early on fridays and she did not know that. She is leaving Sunday to go out of town for 1 week and will be out of her medication. Wants to know if you can do a week refill until she can get refilled from them?

## 2020-06-02 ENCOUNTER — Other Ambulatory Visit: Payer: Self-pay | Admitting: Psychiatry

## 2020-06-02 DIAGNOSIS — F411 Generalized anxiety disorder: Secondary | ICD-10-CM

## 2020-06-06 ENCOUNTER — Other Ambulatory Visit: Payer: Self-pay

## 2020-06-06 ENCOUNTER — Telehealth (INDEPENDENT_AMBULATORY_CARE_PROVIDER_SITE_OTHER): Payer: 59 | Admitting: Psychiatry

## 2020-06-06 DIAGNOSIS — F411 Generalized anxiety disorder: Secondary | ICD-10-CM

## 2020-06-06 MED ORDER — ALPRAZOLAM 0.5 MG PO TABS: 0.5000 mg | ORAL_TABLET | ORAL | 0 refills | Status: DC

## 2020-06-06 NOTE — Progress Notes (Signed)
Writer contacted with patient at the time of appointment.  Patient picked up the phone and said that she was not aware that she had an appointment and she is currently at work. She wants to reschedule this appointment.  Provided her an appointment for next month. Patient wants refill for Xanax.  Discussed that Xanax is being tapered off per our discussion last visit.  However will provide a short supply. Patient reports she has enough Prozac available and does not need refills at the pharmacy today.

## 2020-06-11 ENCOUNTER — Other Ambulatory Visit: Payer: Self-pay | Admitting: Psychiatry

## 2020-06-11 DIAGNOSIS — F411 Generalized anxiety disorder: Secondary | ICD-10-CM

## 2020-06-11 DIAGNOSIS — F5105 Insomnia due to other mental disorder: Secondary | ICD-10-CM

## 2020-06-15 ENCOUNTER — Other Ambulatory Visit: Payer: Self-pay | Admitting: Psychiatry

## 2020-06-15 DIAGNOSIS — F411 Generalized anxiety disorder: Secondary | ICD-10-CM

## 2020-06-15 DIAGNOSIS — F5105 Insomnia due to other mental disorder: Secondary | ICD-10-CM

## 2020-07-05 ENCOUNTER — Telehealth (INDEPENDENT_AMBULATORY_CARE_PROVIDER_SITE_OTHER): Payer: 59 | Admitting: Psychiatry

## 2020-07-05 ENCOUNTER — Other Ambulatory Visit: Payer: Self-pay

## 2020-07-05 DIAGNOSIS — F411 Generalized anxiety disorder: Secondary | ICD-10-CM

## 2020-07-05 NOTE — Progress Notes (Signed)
Attempted to contact patient multiple times, attempted to contact via video. Try to contact patient by phone, however phone call could not be completed after multiple attempts. Sent text and email link.

## 2020-07-10 ENCOUNTER — Telehealth: Payer: Self-pay

## 2020-07-10 DIAGNOSIS — F411 Generalized anxiety disorder: Secondary | ICD-10-CM

## 2020-07-10 MED ORDER — ALPRAZOLAM 0.5 MG PO TABS: 0.5000 mg | ORAL_TABLET | ORAL | 0 refills | Status: DC

## 2020-07-10 NOTE — Telephone Encounter (Signed)
Patient could not connect during her last appointment with writer.  Patient also could not connect the appointment before that.  We will have staff contact patient to schedule an in person visit since she is having trouble.

## 2020-07-10 NOTE — Telephone Encounter (Signed)
Pt called left message that she needs a refill on her medication  Xanax.

## 2020-07-17 ENCOUNTER — Telehealth: Payer: Self-pay | Admitting: Nurse Practitioner

## 2020-07-17 NOTE — Telephone Encounter (Signed)
Patient called in about her medicationQUEtiapine (SEROQUEL) 50 MG tablet wanted to know if she could get another kind because this one is causing her to gain weight

## 2020-07-17 NOTE — Telephone Encounter (Signed)
She needs to talk to Dr. Elna Breslow in Psychiatry about this Seroquel dose change.

## 2020-07-17 NOTE — Telephone Encounter (Signed)
Spoke with patient and advised her to call Dr. Elna Breslow about medication change. Patient states she will call their office tomorrow.

## 2020-07-24 IMAGING — MG DIGITAL SCREENING BILATERAL MAMMOGRAM WITH IMPLANTS, CAD AND TOM
9 of 19 series · 9 of 39 positions shown · non-contrast
Comparison: Previous exam(s).

CLINICAL DATA: Screening.

EXAM:
DIGITAL SCREENING BILATERAL MAMMOGRAM WITH IMPLANTS, CAD AND TOMO
The patient has retropectoral implants. Standard and implant
displaced views were performed.

[L CC]
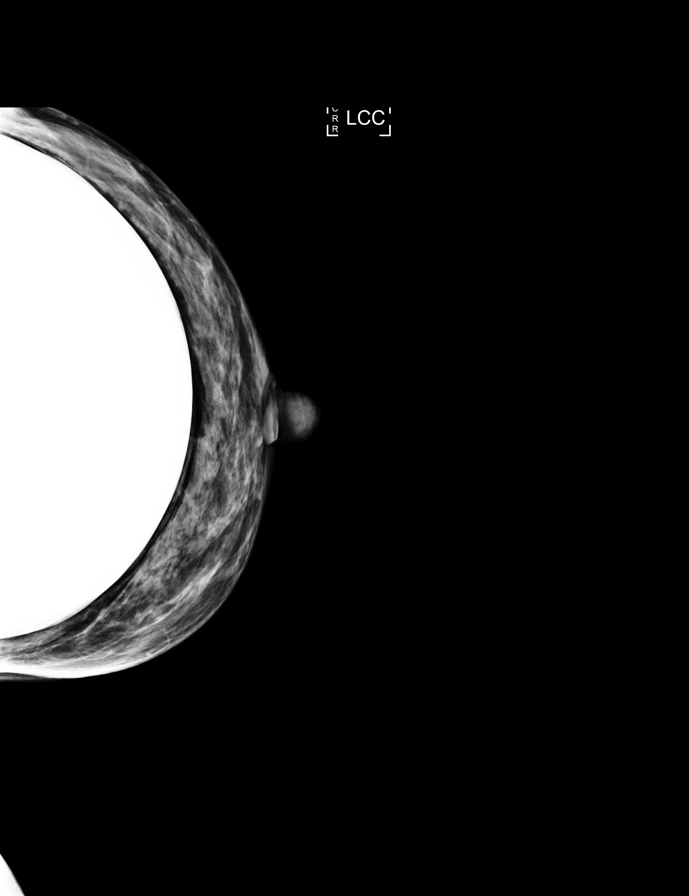

[R CC (1 of 2)]
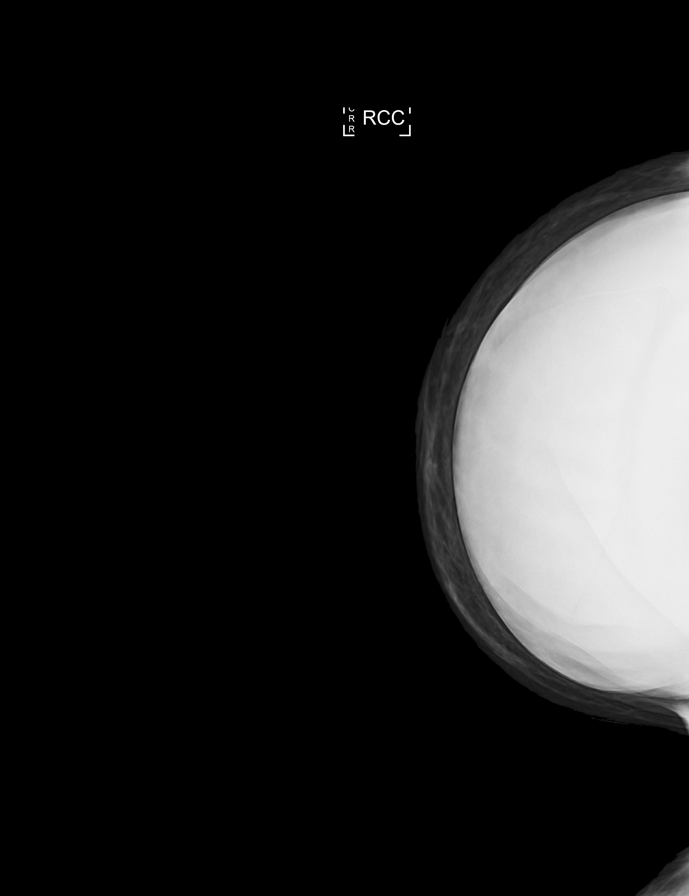

[L MLO (1 of 2)]
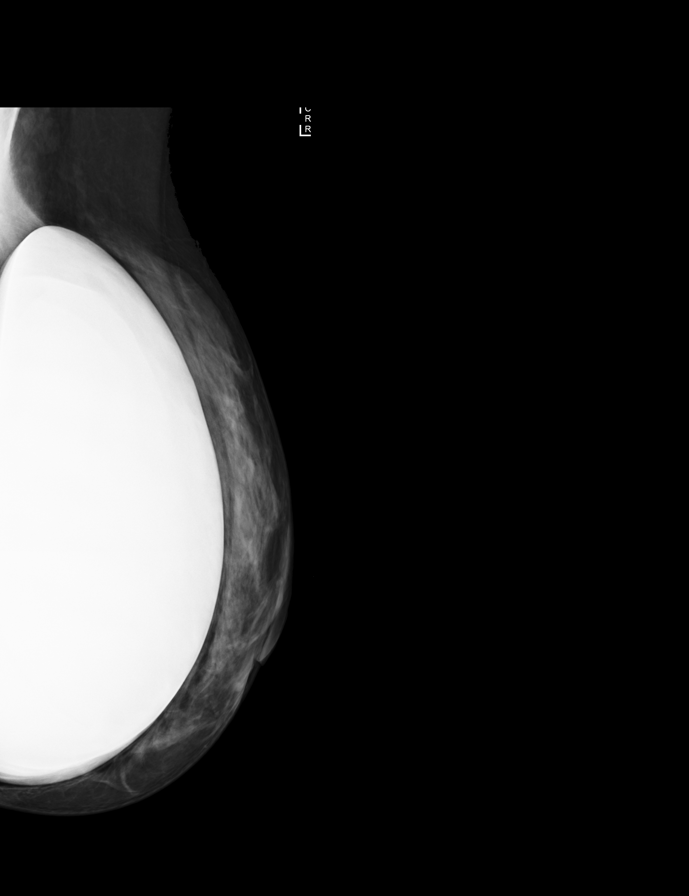

[R MLO (1 of 2)]
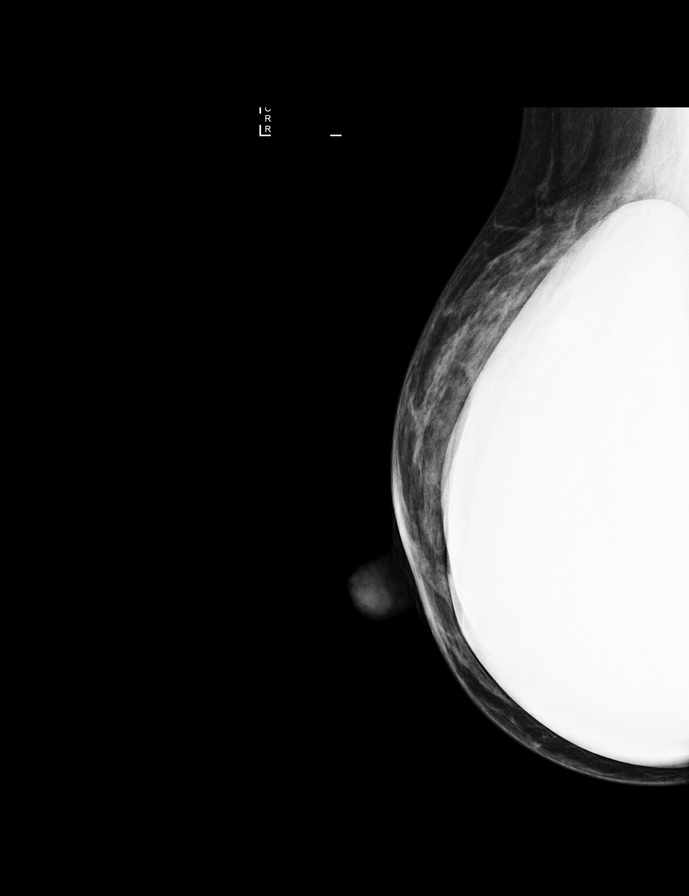

[R CC (2 of 2)]
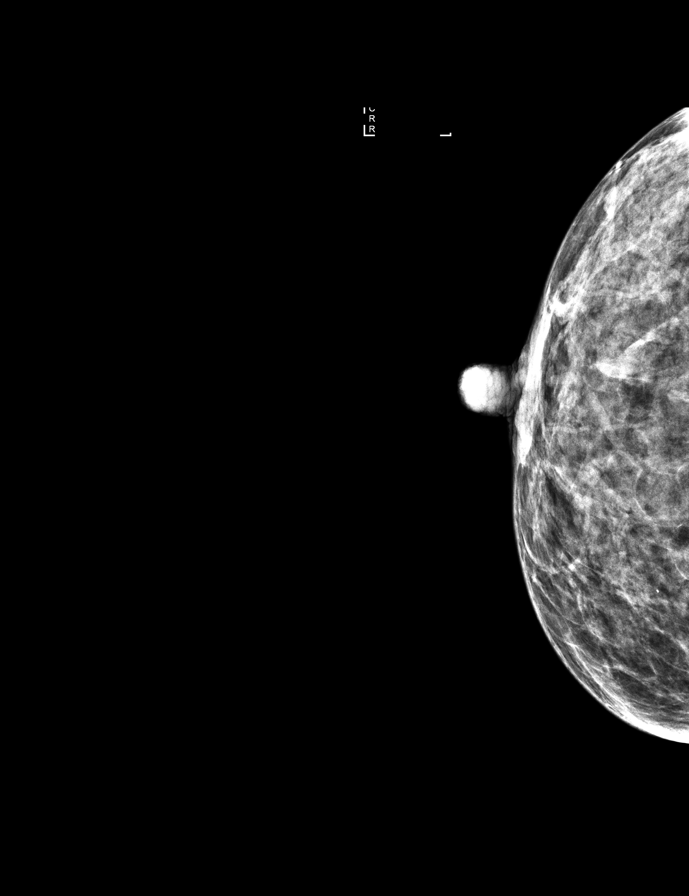

[R MLO (2 of 2)]
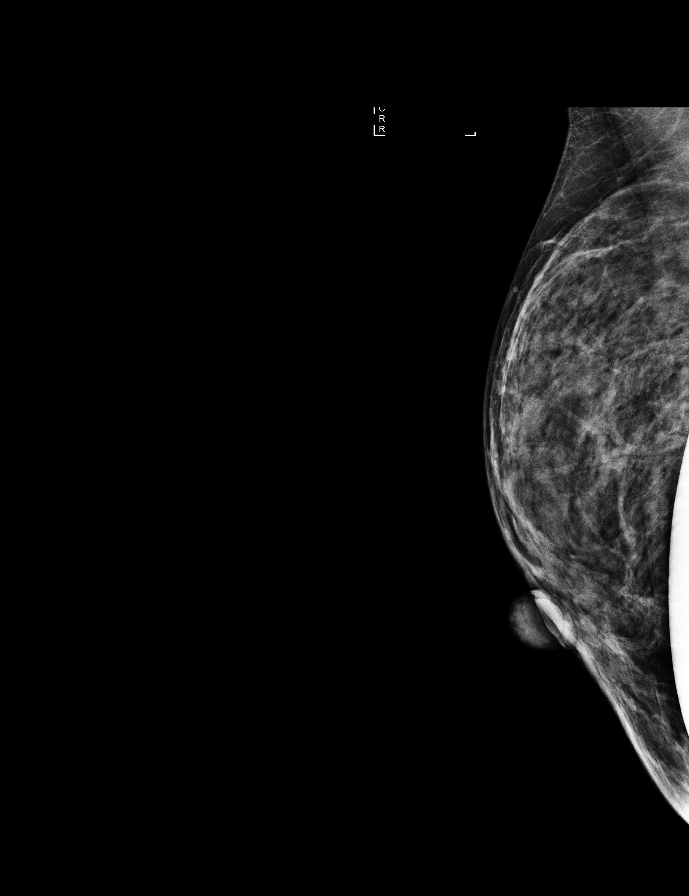

[L MLO (2 of 2)]
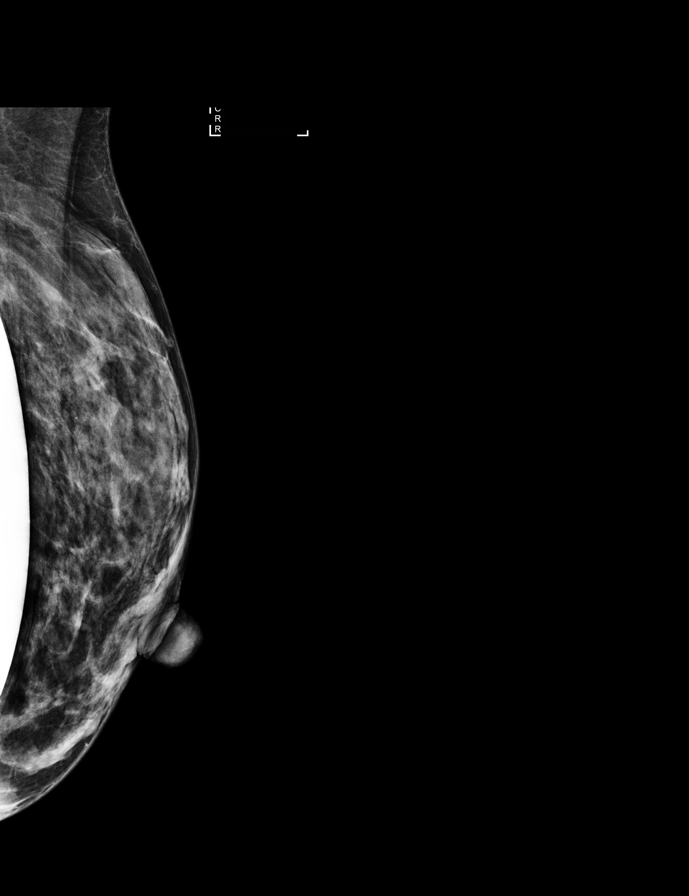

[R CC synth-2D]
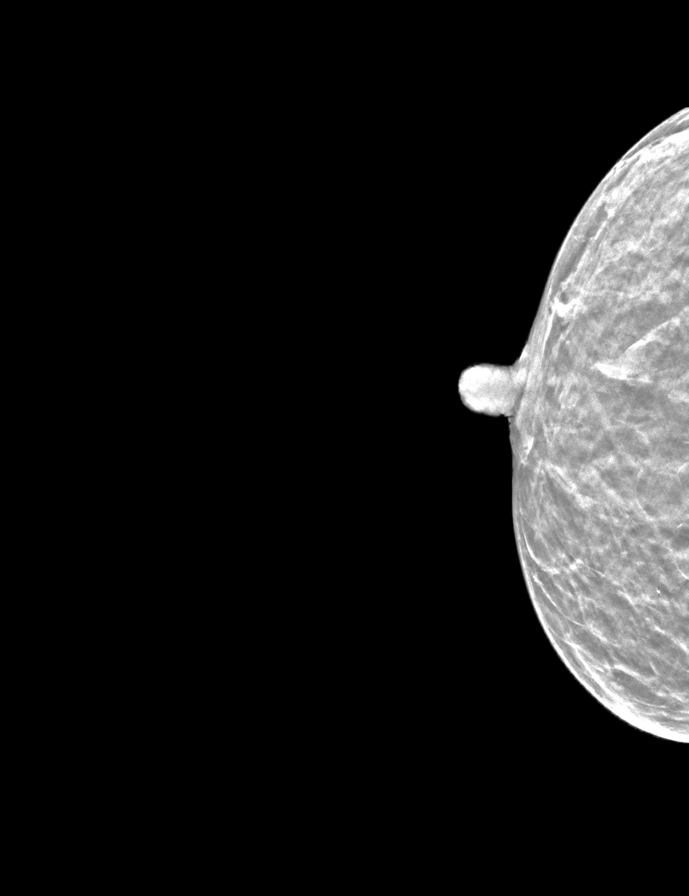

[L CC synth-2D]
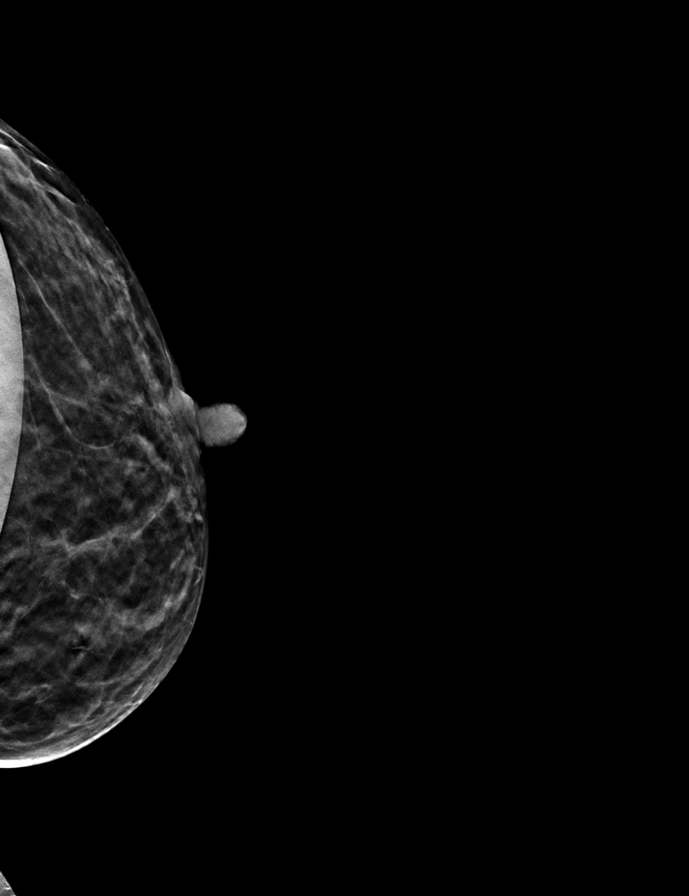

[9 of 39 positions shown; findings below may reference images not displayed]

ACR Breast Density Category d: The breast tissue is extremely dense,
which lowers the sensitivity of mammography.
FINDINGS: There are no findings suspicious for malignancy. Bilateral intact
subpectoral gel implants. Images were processed with CAD.
IMPRESSION: No mammographic evidence of malignancy. A result letter of this
screening mammogram will be mailed directly to the patient.

RECOMMENDATION:
Screening mammogram in one year. (Code:DM-L-ORZ)

BI-RADS CATEGORY  1:  Negative.

## 2020-08-04 ENCOUNTER — Other Ambulatory Visit: Payer: Self-pay | Admitting: Psychiatry

## 2020-08-04 DIAGNOSIS — F411 Generalized anxiety disorder: Secondary | ICD-10-CM

## 2020-08-08 ENCOUNTER — Other Ambulatory Visit: Payer: Self-pay

## 2020-08-08 DIAGNOSIS — F411 Generalized anxiety disorder: Secondary | ICD-10-CM

## 2020-08-09 NOTE — Telephone Encounter (Signed)
Please contact the patient.  Based on review of her chart it appears that she is following with Dr. Elna Breslow and she has been working on tapering the patient off of her Xanax.  She will need to contact that physician to discuss potential refills.

## 2020-09-12 ENCOUNTER — Ambulatory Visit: Payer: BC Managed Care – PPO | Admitting: Certified Nurse Midwife

## 2020-09-16 ENCOUNTER — Telehealth: Payer: 59 | Admitting: Nurse Practitioner

## 2020-09-16 DIAGNOSIS — J00 Acute nasopharyngitis [common cold]: Secondary | ICD-10-CM

## 2020-09-16 MED ORDER — BENZONATATE 100 MG PO CAPS: 100.0000 mg | ORAL_CAPSULE | Freq: Three times a day (TID) | ORAL | 0 refills | Status: DC | PRN

## 2020-09-16 MED ORDER — IBUPROFEN 800 MG PO TABS: 800.0000 mg | ORAL_TABLET | Freq: Three times a day (TID) | ORAL | 0 refills | Status: DC | PRN

## 2020-09-16 MED ORDER — FLUTICASONE PROPIONATE 50 MCG/ACT NA SUSP
2.0000 | Freq: Every day | NASAL | 6 refills | Status: DC
Start: 2020-09-16 — End: 2023-04-28

## 2020-09-16 NOTE — Progress Notes (Signed)

## 2020-09-16 NOTE — Addendum Note (Signed)
Addended by: Bennie Pierini on: 09/16/2020 03:06 PM   Modules accepted: Orders

## 2020-09-17 ENCOUNTER — Encounter: Payer: Self-pay | Admitting: Physician Assistant

## 2020-09-17 ENCOUNTER — Telehealth: Payer: 59 | Admitting: Physician Assistant

## 2020-09-17 DIAGNOSIS — U071 COVID-19: Secondary | ICD-10-CM | POA: Diagnosis not present

## 2020-09-17 DIAGNOSIS — J0100 Acute maxillary sinusitis, unspecified: Secondary | ICD-10-CM | POA: Diagnosis not present

## 2020-09-17 MED ORDER — AZITHROMYCIN 250 MG PO TABS: ORAL_TABLET | ORAL | 0 refills | Status: DC

## 2020-09-17 NOTE — Progress Notes (Signed)
I connected with  Kimberly Romero on 09/17/20 by a video enabled telemedicine application and verified that I am speaking with the correct person using two identifiers.   I discussed the limitations of evaluation and management by telemedicine. The patient expressed understanding and agreed to proceed.    New Patient Office Visit  Subjective:  Patient ID: Kimberly Romero, female    DOB: 1981-01-14  Age: 40 y.o. MRN: [id number]  CC:  Chief Complaint  Patient presents with  . Covid Positive   Virtual Visit via Video Note  I connected with Kimberly Romero on 09/17/20 at  3:00 PM EST by a video enabled telemedicine application and verified that I am speaking with the correct person using two identifiers. Due to technical difficulties, visit was completed with audio only  Location: Patient: Home Provider: Working remotely from home   I discussed the limitations of evaluation and management by telemedicine and the availability of in person appointments. The patient expressed understanding and agreed to proceed.  History of Present Illness:  Kimberly Romero started experiencing cold symptoms approximately 1 week ago.  Reports that she took 2 home Covid test on Wednesday, both are positive.  Reports that her symptoms worsened on Friday.  Reports that she continues to have a severe headache, bilateral ear ringing, bilateral ear pain and pressure, elevated temperature, body aches.  Reports that she has been having a dry cough, and green nasal discharge.  Endorses facial swelling and tenderness. reports that she did have a slight episode of nausea earlier today.  Reports that she has been using Advil, Tylenol and ibuprofen.  Reports that she has been taking ibuprofen 400 mg every 2 hours without any relief.  Reports that it is keeping her fever at 100 degrees.  States that she previously had a ED visit and was prescribed Tessalon Perles, and Flonase.  Reports that she has both of these at  home and they are not offering relief. Eating and drinking okay, low appetitie.   Reports that she did have 2  Moderna Covid vaccines, states her second vaccine was 2 weeks ago       Observations/Objective: Medical history and current medications reviewed, no physical exam completed     Past Medical History:  Diagnosis Date  . Anxiety   . Depression   . Disturbed sleep rhythm   . Enlarged thyroid   . History of hypothyroidism   . Kidney stones   . Vaginal Pap smear, abnormal    years ago    Past Surgical History:  Procedure Laterality Date  . AUGMENTATION MAMMAPLASTY Bilateral 2013  . BREAST SURGERY    . CESAREAN SECTION     X 2    Family History  Problem Relation Age of Onset  . Cancer Maternal Grandfather        colon  . Prostate cancer Neg Hx   . Kidney cancer Neg Hx   . Breast cancer Neg Hx   . Ovarian cancer Neg Hx   . Colon cancer Neg Hx   . Mental illness Neg Hx     Social History   Socioeconomic History  . Marital status: Divorced    Spouse name: Not on file  . Number of children: Not on file  . Years of education: Not on file  . Highest education level: Not on file  Occupational History  . Not on file  Tobacco Use  . Smoking status: Never Smoker  . Smokeless tobacco: Never Used  Vaping Use  . Vaping Use: Never used  Substance and Sexual Activity  . Alcohol use: Yes    Comment: Occasional   . Drug use: No  . Sexual activity: Yes    Birth control/protection: I.U.D.    Comment: Mirena  Other Topics Concern  . Not on file  Social History Narrative  . Not on file   Social Determinants of Health   Financial Resource Strain: Not on file  Food Insecurity: Not on file  Transportation Needs: Not on file  Physical Activity: Not on file  Stress: Not on file  Social Connections: Not on file  Intimate Partner Violence: Not on file    ROS Review of Systems  Constitutional: Positive for chills, fatigue and fever.  HENT: Positive for  congestion, ear pain, sinus pressure and sinus pain. Negative for sore throat and trouble swallowing.   Eyes: Negative.   Respiratory: Positive for cough. Negative for shortness of breath and wheezing.   Cardiovascular: Negative for chest pain.  Gastrointestinal: Positive for nausea. Negative for abdominal pain.  Endocrine: Negative.   Genitourinary: Negative.   Musculoskeletal: Positive for back pain.  Skin: Negative.   Allergic/Immunologic: Negative.   Neurological: Positive for headaches.  Hematological: Negative.   Psychiatric/Behavioral: Negative.     Objective:   Today's Vitals: There were no vitals taken for this visit.    Assessment & Plan:   Problem List Items Addressed This Visit      Other   COVID-19 - Primary   Relevant Medications   azithromycin (ZITHROMAX) 250 MG tablet    Other Visit Diagnoses    Acute non-recurrent maxillary sinusitis       Relevant Medications   azithromycin (ZITHROMAX) 250 MG tablet      Outpatient Encounter Medications as of 09/17/2020  Medication Sig  . azithromycin (ZITHROMAX) 250 MG tablet Take 2 tabs PO day one and then take 1 tab PO daily  . ALPRAZolam (XANAX) 0.5 MG tablet Take 1 tablet (0.5 mg total) by mouth as directed. Take 1 tablet 1-2 times a week only for severe anxiety attacks  . benzonatate (TESSALON PERLES) 100 MG capsule Take 1 capsule (100 mg total) by mouth 3 (three) times daily as needed.  . cetirizine (ZYRTEC) 10 MG tablet Take 1 tablet (10 mg total) by mouth daily. (Patient not taking: Reported on 05/01/2020)  . FLUoxetine (PROZAC) 40 MG capsule TAKE 1 CAPSULE(40 MG) BY MOUTH DAILY WITH BREAKFAST  . fluticasone (FLONASE) 50 MCG/ACT nasal spray Place 2 sprays into both nostrils daily.  Marland Kitchen HYDROcodone-acetaminophen (NORCO/VICODIN) 5-325 MG tablet Take 1 tablet by mouth every 6 (six) hours as needed. (Patient not taking: Reported on 05/01/2020)  . hydrOXYzine (ATARAX/VISTARIL) 50 MG tablet TAKE 1 TABLET(50 MG) BY MOUTH  AT BEDTIME AS NEEDED  . ibuprofen (ADVIL) 800 MG tablet Take 1 tablet (800 mg total) by mouth every 8 (eight) hours as needed.  Marland Kitchen levonorgestrel (MIRENA) 20 MCG/24HR IUD 1 each by Intrauterine route once.  . naproxen (NAPROSYN) 500 MG tablet SMARTSIG:1 Tablet(s) By Mouth Every 12 Hours PRN (Patient not taking: Reported on 05/01/2020)  . QUEtiapine (SEROQUEL) 50 MG tablet TAKE 1 AND 1/2 TABLETS(75 MG) BY MOUTH AT BEDTIME  . [DISCONTINUED] amoxicillin-clavulanate (AUGMENTIN) 875-125 MG tablet Take 1 tablet by mouth every 12 (twelve) hours. (Patient not taking: Reported on 05/01/2020)  . [DISCONTINUED] nitrofurantoin, macrocrystal-monohydrate, (MACROBID) 100 MG capsule Take 100 mg by mouth every 12 (twelve) hours.   No facility-administered encounter medications on file as of 09/17/2020.  Assessment and Plan: 1. COVID-19 Continue supportive care, encourage patient to use Tessalon Perles, Flonase, as prescribed.  Trial azithromycin.  Patient education given.  Patient to follow-up with mobile medicine unit on Wednesday, September 20, 2020.  Red flags given for prompt reevaluation - azithromycin (ZITHROMAX) 250 MG tablet; Take 2 tabs PO day one and then take 1 tab PO daily  Dispense: 6 tablet; Refill: 0  2. Acute non-recurrent maxillary sinusitis  - azithromycin (ZITHROMAX) 250 MG tablet; Take 2 tabs PO day one and then take 1 tab PO daily  Dispense: 6 tablet; Refill: 0   Follow Up Instructions:    I discussed the assessment and treatment plan with the patient. The patient was provided an opportunity to ask questions and all were answered. The patient agreed with the plan and demonstrated an understanding of the instructions.   The patient was advised to call back or seek an in-person evaluation if the symptoms worsen or if the condition fails to improve as anticipated.  I provided 21 minutes of non-face-to-face time during this encounter.     Follow-up: Return in about 3 days (around  09/20/2020).   Kasandra Knudsen Alaa Mullally, PA-C

## 2020-09-17 NOTE — Patient Instructions (Addendum)
I encourage you to use ibuprofen 800 mg every 6-8 hours as needed for pain relief.  Continue staying very well-hydrated.  I encourage you to use Tessalon Perles 200 mg twice a day as needed for cough.  I encourage you to use Flonase 2 squirts in each side twice a day for the next few days.  I sent azithromycin to your pharmacy, please take as directed.  My staff will call you tomorrow to schedule an appointment for you to have a virtual visit for follow-up with me on Wednesday September 20, 2020.  I hope that you feel better soon, please let us know if there is anything else we can do for you  Kimberly Jaffe, PA-C Physician Assistant [facility] [email or web address]    Sinusitis, Adult Sinusitis is inflammation of your sinuses. Sinuses are hollow spaces in the bones around your face. Your sinuses are located:  Around your eyes.  In the middle of your forehead.  Behind your nose.  In your cheekbones. Mucus normally drains out of your sinuses. When your nasal tissues become inflamed or swollen, mucus can become trapped or blocked. This allows bacteria, viruses, and fungi to grow, which leads to infection. Most infections of the sinuses are caused by a virus. Sinusitis can develop quickly. It can last for up to 4 weeks (acute) or for more than 12 weeks (chronic). Sinusitis often develops after a cold. What are the causes? This condition is caused by anything that creates swelling in the sinuses or stops mucus from draining. This includes:  Allergies.  Asthma.  Infection from bacteria or viruses.  Deformities or blockages in your nose or sinuses.  Abnormal growths in the nose (nasal polyps).  Pollutants, such as chemicals or irritants in the air.  Infection from fungi (rare). What increases the risk? You are more likely to develop this condition if you:  Have a weak body defense system (immune system).  Do a lot of swimming  or diving.  Overuse nasal sprays.  Smoke. What are the signs or symptoms? The main symptoms of this condition are pain and a feeling of pressure around the affected sinuses. Other symptoms include:  Stuffy nose or congestion.  Thick drainage from your nose.  Swelling and warmth over the affected sinuses.  Headache.  Upper toothache.  A cough that may get worse at night.  Extra mucus that collects in the throat or the back of the nose (postnasal drip).  Decreased sense of smell and taste.  Fatigue.  A fever.  Sore throat.  Bad breath. How is this diagnosed? This condition is diagnosed based on:  Your symptoms.  Your medical history.  A physical exam.  Tests to find out if your condition is acute or chronic. This may include: ? Checking your nose for nasal polyps. ? Viewing your sinuses using a device that has a light (endoscope). ? Testing for allergies or bacteria. ? Imaging tests, such as an MRI or CT scan. In rare cases, a bone biopsy may be done to rule out more serious types of fungal sinus disease. How is this treated? Treatment for sinusitis depends on the cause and whether your condition is chronic or acute.  If caused by a virus, your symptoms should go away on their own within 10 days. You may be given medicines to relieve symptoms. They include: ? Medicines that shrink swollen nasal passages (topical intranasal decongestants). ? Medicines that treat allergies (antihistamines). ? A spray that eases inflammation of the nostrils (  topical intranasal corticosteroids). ? Rinses that help get rid of thick mucus in your nose (nasal saline washes).  If caused by bacteria, your health care provider may recommend waiting to see if your symptoms improve. Most bacterial infections will get better without antibiotic medicine. You may be given antibiotics if you have: ? A severe infection. ? A weak immune system.  If caused by narrow nasal passages or nasal  polyps, you may need to have surgery. Follow these instructions at home: Medicines  Take, use, or apply over-the-counter and prescription medicines only as told by your health care provider. These may include nasal sprays.  If you were prescribed an antibiotic medicine, take it as told by your health care provider. Do not stop taking the antibiotic even if you start to feel better. Hydrate and humidify  Drink enough fluid to keep your urine pale yellow. Staying hydrated will help to thin your mucus.  Use a cool mist humidifier to keep the humidity level in your home above 50%.  Inhale steam for 10-15 minutes, 3-4 times a day, or as told by your health care provider. You can do this in the bathroom while a hot shower is running.  Limit your exposure to cool or dry air.   Rest  Rest as much as possible.  Sleep with your head raised (elevated).  Make sure you get enough sleep each night. General instructions  Apply a warm, moist washcloth to your face 3-4 times a day or as told by your health care provider. This will help with discomfort.  Wash your hands often with soap and water to reduce your exposure to germs. If soap and water are not available, use hand sanitizer.  Do not smoke. Avoid being around people who are smoking (secondhand smoke).  Keep all follow-up visits as told by your health care provider. This is important.   Contact a health care provider if:  You have a fever.  Your symptoms get worse.  Your symptoms do not improve within 10 days. Get help right away if:  You have a severe headache.  You have persistent vomiting.  You have severe pain or swelling around your face or eyes.  You have vision problems.  You develop confusion.  Your neck is stiff.  You have trouble breathing. Summary  Sinusitis is soreness and inflammation of your sinuses. Sinuses are hollow spaces in the bones around your face.  This condition is caused by nasal tissues that  become inflamed or swollen. The swelling traps or blocks the flow of mucus. This allows bacteria, viruses, and fungi to grow, which leads to infection.  If you were prescribed an antibiotic medicine, take it as told by your health care provider. Do not stop taking the antibiotic even if you start to feel better.  Keep all follow-up visits as told by your health care provider. This is important. This information is not intended to replace advice given to you by your health care provider. Make sure you discuss any questions you have with your health care provider. Document Revised: 01/05/2018 Document Reviewed: 01/05/2018 Elsevier Patient Education  2021 [employer].

## 2020-09-18 ENCOUNTER — Ambulatory Visit: Payer: Self-pay | Admitting: Certified Nurse Midwife

## 2020-09-20 ENCOUNTER — Encounter: Payer: Self-pay | Admitting: Physician Assistant

## 2020-09-20 ENCOUNTER — Encounter: Payer: 59 | Admitting: Physician Assistant

## 2020-09-20 DIAGNOSIS — Z8616 Personal history of COVID-19: Secondary | ICD-10-CM

## 2020-09-20 NOTE — Progress Notes (Signed)
Patient no show  This encounter was created in error - please disregard.

## 2020-09-24 ENCOUNTER — Telehealth: Payer: 59 | Admitting: Nurse Practitioner

## 2020-09-24 DIAGNOSIS — R399 Unspecified symptoms and signs involving the genitourinary system: Secondary | ICD-10-CM | POA: Diagnosis not present

## 2020-09-24 MED ORDER — NITROFURANTOIN MONOHYD MACRO 100 MG PO CAPS
100.0000 mg | ORAL_CAPSULE | Freq: Two times a day (BID) | ORAL | 0 refills | Status: AC
Start: 2020-09-24 — End: 2020-09-29

## 2020-09-24 NOTE — Progress Notes (Signed)
We are sorry that you are not feeling well.  Here is how we plan to help!  Based on what you shared with me it looks like you most likely have a simple urinary tract infection.  A UTI (Urinary Tract Infection) is a bacterial infection of the bladder.  Most cases of urinary tract infections are simple to treat but a key part of your care is to encourage you to drink plenty of fluids and watch your symptoms carefully.  I have prescribed MacroBid 100 mg twice a day for 5 days.  Your symptoms should gradually improve. Call us if the burning in your urine worsens, you develop worsening fever, back pain or pelvic pain or if your symptoms do not resolve after completing the antibiotic.  Urinary tract infections can be prevented by drinking plenty of water to keep your body hydrated.  Also be sure when you wipe, wipe from front to back and don't hold it in!  If possible, empty your bladder every 4 hours.  Your e-visit answers were reviewed by a board certified advanced clinical practitioner to complete your personal care plan.  Depending on the condition, your plan could have included both over the counter or prescription medications.  If there is a problem please reply  once you have received a response from your provider.  Your safety is important to us.  If you have drug allergies check your prescription carefully.    You can use MyChart to ask questions about today's visit, request a non-urgent call back, or ask for a work or school excuse for 24 hours related to this e-Visit. If it has been greater than 24 hours you will need to follow up with your provider, or enter a new e-Visit to address those concerns.   You will get an e-mail in the next two days asking about your experience.  I hope that your e-visit has been valuable and will speed your recovery. Thank you for using e-visits.  I have spent at least 5 minutes reviewing and documenting in the patient's chart.  

## 2020-09-25 ENCOUNTER — Telehealth: Payer: 59 | Admitting: Physician Assistant

## 2020-09-25 ENCOUNTER — Ambulatory Visit: Payer: Self-pay | Admitting: Certified Nurse Midwife

## 2020-09-25 DIAGNOSIS — N12 Tubulo-interstitial nephritis, not specified as acute or chronic: Secondary | ICD-10-CM

## 2020-09-25 DIAGNOSIS — U099 Post covid-19 condition, unspecified: Secondary | ICD-10-CM

## 2020-09-25 NOTE — Progress Notes (Signed)
Ms. Kimberly Romero are scheduled for a virtual visit with your provider today.    Just as we do with appointments in the office, we must obtain your consent to participate.  Your consent will be active for this visit and any virtual visit you may have with one of our providers in the next 365 days.    If you have a MyChart account, I can also send a copy of this consent to you electronically.  All virtual visits are billed to your insurance company just like a traditional visit in the office.  As this is a virtual visit, video technology does not allow for your provider to perform a traditional examination.  This may limit your provider's ability to fully assess your condition.  If your provider identifies any concerns that need to be evaluated in person or the need to arrange testing such as labs, EKG, etc, we will make arrangements to do so.    Although advances in technology are sophisticated, we cannot ensure that it will always work on either your end or our end.  If the connection with a video visit is poor, we may have to switch to a telephone visit.  With either a video or telephone visit, we are not always able to ensure that we have a secure connection.   I need to obtain your verbal consent now.   Are you willing to proceed with your visit today?   Kimberly Romero has provided verbal consent on 09/25/2020 for a virtual visit (video or telephone).  Piedad Climes, PA-C 09/25/2020  5:10 PM     Virtual Visit via Video   I connected with patient on 09/25/20 at  5:15 PM EST by a video enabled telemedicine application and verified that I am speaking with the correct person using two identifiers.  Location patient: Home Location provider: [facility], [facility] Persons participating in the virtual visit: Patient, Provider, CMA (Patina Moore)  I discussed the limitations of evaluation and management by telemedicine and the availability of in person appointments. The patient  expressed understanding and agreed to proceed.  Subjective:   HPI:   Patient presents via [facility] today to discuss worsening symptoms. Patient diagnosed with COVID at the end of January with subsequent treatment of suspected secondary bronchitis. Notes symptoms got better with antibiotic and doing well up until this week when she started having dysuria, urinary urgency and hesitancy. Was seen via e-visit yesterday and diagnosed with UTI, started on Macrobid which she endorses taking as directed. Today with continued dysuria now with more hesitancy, bilateral flank pain, chills, aches and fever up to 102. Denies chest pain or SOB. Denies hematuria. Denies vomiting. Still with residual nasal congestion and sinus pressure but without sinus pain. Denies any further sick contact since her initial COVID diagnosis. Is currently out of town in [place].    ROS:   See pertinent positives and negatives per HPI.  Patient Active Problem List   Diagnosis Date Noted  . COVID-19 09/17/2020  . GAD (generalized anxiety disorder) 05/01/2020  . 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 At risk for long QT syndrome 05/01/2020  . Anxiety 05/31/2019    Social History   Tobacco Use  . Smoking status: Never Smoker  . Smokeless tobacco: Never Used  Substance Use Topics  . Alcohol use: Yes    Comment: Occasional     Current Outpatient Medications:  .  ALPRAZolam (XANAX) 0.5 MG tablet, Take 1 tablet (0.5 mg total) by mouth  as directed. Take 1 tablet 1-2 times a week only for severe anxiety attacks, Disp: 10 tablet, Rfl: 0 .  azithromycin (ZITHROMAX) 250 MG tablet, Take 2 tabs PO day one and then take 1 tab PO daily, Disp: 6 tablet, Rfl: 0 .  benzonatate (TESSALON PERLES) 100 MG capsule, Take 1 capsule (100 mg total) by mouth 3 (three) times daily as needed., Disp: 20 capsule, Rfl: 0 .  cetirizine (ZYRTEC) 10 MG tablet, Take 1 tablet (10 mg total) by mouth daily. (Patient not taking: Reported on  05/01/2020), Disp: 30 tablet, Rfl: 0 .  FLUoxetine (PROZAC) 40 MG capsule, TAKE 1 CAPSULE(40 MG) BY MOUTH DAILY WITH BREAKFAST, Disp: 30 capsule, Rfl: 1 .  fluticasone (FLONASE) 50 MCG/ACT nasal spray, Place 2 sprays into both nostrils daily., Disp: 16 g, Rfl: 6 .  HYDROcodone-acetaminophen (NORCO/VICODIN) 5-325 MG tablet, Take 1 tablet by mouth every 6 (six) hours as needed. (Patient not taking: Reported on 05/01/2020), Disp: 15 tablet, Rfl: 0 .  hydrOXYzine (ATARAX/VISTARIL) 50 MG tablet, TAKE 1 TABLET(50 MG) BY MOUTH AT BEDTIME AS NEEDED, Disp: 45 tablet, Rfl: 1 .  ibuprofen (ADVIL) 800 MG tablet, Take 1 tablet (800 mg total) by mouth every 8 (eight) hours as needed., Disp: 30 tablet, Rfl: 0 .  levonorgestrel (MIRENA) 20 MCG/24HR IUD, 1 each by Intrauterine route once., Disp: , Rfl:  .  naproxen (NAPROSYN) 500 MG tablet, SMARTSIG:1 Tablet(s) By Mouth Every 12 Hours PRN (Patient not taking: Reported on 05/01/2020), Disp: , Rfl:  .  nitrofurantoin, macrocrystal-monohydrate, (MACROBID) 100 MG capsule, Take 1 capsule (100 mg total) by mouth 2 (two) times daily for 5 days., Disp: 10 capsule, Rfl: 0 .  QUEtiapine (SEROQUEL) 50 MG tablet, TAKE 1 AND 1/2 TABLETS(75 MG) BY MOUTH AT BEDTIME, Disp: 45 tablet, Rfl: 1  Allergies  Allergen Reactions  . Sulfa Antibiotics Other (See Comments)    UNKNOWN    Objective:   There were no vitals taken for this visit.  Patient is well-developed, well-nourished in mild painful distress.  Resting in bed at hotel  Head is normocephalic, atraumatic.  No labored breathing.  Speech is clear and coherent with logical content.  Patient is alert and oriented at baseline.   Assessment and Plan:   1. Pyelonephritis 2. Post-COVID syndrome Concern giving continued UTI symptoms now with fever, chills, aches and flank pain. She is out of town. Giving level of pain and recent COVID with post-covid symptoms that she be evaluated in person at nearest  ER setting as she  likely needs IV antibiotics in addition to urine testing.       Piedad Climes, [place] 09/25/2020

## 2020-09-26 ENCOUNTER — Other Ambulatory Visit: Payer: Self-pay | Admitting: Nurse Practitioner

## 2020-10-03 ENCOUNTER — Other Ambulatory Visit: Payer: Self-pay

## 2020-10-03 ENCOUNTER — Ambulatory Visit (INDEPENDENT_AMBULATORY_CARE_PROVIDER_SITE_OTHER): Payer: Self-pay | Admitting: Certified Nurse Midwife

## 2020-10-03 ENCOUNTER — Encounter: Payer: Self-pay | Admitting: Certified Nurse Midwife

## 2020-10-03 VITALS — BP 123/79 | HR 79 | Resp 16 | Ht 63.0 in | Wt 140.4 lb

## 2020-10-03 DIAGNOSIS — Z30433 Encounter for removal and reinsertion of intrauterine contraceptive device: Secondary | ICD-10-CM

## 2020-10-03 DIAGNOSIS — Z87440 Personal history of urinary (tract) infections: Secondary | ICD-10-CM

## 2020-10-03 DIAGNOSIS — R350 Frequency of micturition: Secondary | ICD-10-CM

## 2020-10-03 NOTE — Progress Notes (Signed)
   GYNECOLOGY OFFICE PROCEDURE NOTE  CHRISTELLE IGOE is a 40 y.o. [id number] here for  IUD removal and reinsertion. No GYN concerns.  Last pap smear was on 07/28/2018 and was normal.  IUD Removal and Reinsertion  Patient identified, informed consent performed, consent signed.   Discussed risks of irregular bleeding, cramping, infection, malpositioning or misplacement of the IUD outside the uterus which may require further procedures. Also discussed >99% contraception efficacy, increased risk of ectopic pregnancy with failure of method.   Emphasized that this did not protect against STIs, condoms recommended during all sexual encounters.Advised to use backup contraception for one week as the risk of pregnancy is higher during the transition period of removing an IUD and replacing it with another one. Time out was performed. Speculum placed in the vagina. The strings of the IUD were grasped and pulled using ring forceps. The IUD was successfully removed in its entirety. The cervix was cleaned with Betadine x 2 and grasped anteriorly with a single tooth tenaculum.  The new Mirena IUD insertion apparatus was used to sound the uterus to 8 cm;  the IUD was then placed per manufacturer's recommendations. Strings trimmed to 3 cm. Tenaculum was removed, good hemostasis noted. Patient tolerated procedure well.   Patient was given post-procedure instructions.  She was reminded to have backup contraception for one week during this transition period between IUDs.  Patient was also asked to check IUD strings periodically and follow up in 4 weeks for IUD check.  Pt requesting urine testing, had kidney infection recently and has concerns she still has uti, she has had some back pain and urinary frequency.    Doreene Burke, CNM

## 2020-10-03 NOTE — Patient Instructions (Signed)

## 2020-10-08 LAB — URINE CULTURE

## 2020-10-09 ENCOUNTER — Other Ambulatory Visit: Payer: Self-pay | Admitting: Certified Nurse Midwife

## 2020-10-09 MED ORDER — CIPROFLOXACIN HCL 250 MG PO TABS: 250.0000 mg | ORAL_TABLET | Freq: Two times a day (BID) | ORAL | 0 refills | Status: AC

## 2020-10-16 ENCOUNTER — Other Ambulatory Visit: Payer: Self-pay | Admitting: Certified Nurse Midwife

## 2020-10-16 MED ORDER — CIPROFLOXACIN HCL 250 MG PO TABS
250.0000 mg | ORAL_TABLET | Freq: Two times a day (BID) | ORAL | 0 refills | Status: AC
Start: 2020-10-16 — End: 2020-10-21

## 2020-10-20 ENCOUNTER — Other Ambulatory Visit: Payer: Self-pay | Admitting: Psychiatry

## 2020-10-20 DIAGNOSIS — F5105 Insomnia due to other mental disorder: Secondary | ICD-10-CM

## 2020-10-20 DIAGNOSIS — F411 Generalized anxiety disorder: Secondary | ICD-10-CM

## 2020-10-31 ENCOUNTER — Encounter: Payer: Self-pay | Admitting: Certified Nurse Midwife

## 2020-11-14 ENCOUNTER — Encounter: Payer: Self-pay | Admitting: Certified Nurse Midwife

## 2020-12-12 ENCOUNTER — Other Ambulatory Visit: Payer: Self-pay | Admitting: Certified Nurse Midwife

## 2020-12-12 MED ORDER — TRAZODONE HCL 50 MG PO TABS: 50.0000 mg | ORAL_TABLET | Freq: Every day | ORAL | 1 refills | Status: DC

## 2021-01-01 ENCOUNTER — Other Ambulatory Visit: Payer: Self-pay | Admitting: Certified Nurse Midwife

## 2021-01-01 MED ORDER — ZOLPIDEM TARTRATE ER 6.25 MG PO TBCR
6.2500 mg | EXTENDED_RELEASE_TABLET | Freq: Every evening | ORAL | 0 refills | Status: DC | PRN
Start: 2021-01-01 — End: 2021-01-22

## 2021-01-22 ENCOUNTER — Other Ambulatory Visit: Payer: Self-pay | Admitting: Certified Nurse Midwife

## 2021-01-22 MED ORDER — ZOLPIDEM TARTRATE 10 MG PO TABS: 10.0000 mg | ORAL_TABLET | Freq: Every evening | ORAL | 5 refills | Status: DC | PRN

## 2021-02-07 ENCOUNTER — Other Ambulatory Visit: Payer: Self-pay | Admitting: Certified Nurse Midwife

## 2021-05-28 ENCOUNTER — Other Ambulatory Visit: Payer: Self-pay

## 2021-05-28 MED ORDER — ZOLPIDEM TARTRATE 10 MG PO TABS: 10.0000 mg | ORAL_TABLET | Freq: Every evening | ORAL | 5 refills | Status: DC | PRN

## 2021-07-09 ENCOUNTER — Encounter: Payer: 59 | Admitting: Certified Nurse Midwife

## 2021-10-25 ENCOUNTER — Telehealth: Payer: Self-pay

## 2021-10-25 NOTE — Telephone Encounter (Signed)
Left vm to confirm 10/29/21 appointment-Toni

## 2021-10-29 ENCOUNTER — Encounter (INDEPENDENT_AMBULATORY_CARE_PROVIDER_SITE_OTHER): Payer: Self-pay

## 2021-10-29 ENCOUNTER — Encounter: Payer: Self-pay | Admitting: Physician Assistant

## 2021-10-29 ENCOUNTER — Other Ambulatory Visit: Payer: Self-pay

## 2021-10-29 ENCOUNTER — Ambulatory Visit: Payer: BLUE CROSS/BLUE SHIELD | Admitting: Physician Assistant

## 2021-10-29 ENCOUNTER — Telehealth: Payer: Self-pay

## 2021-10-29 DIAGNOSIS — R5383 Other fatigue: Secondary | ICD-10-CM | POA: Diagnosis not present

## 2021-10-29 DIAGNOSIS — Z7689 Persons encountering health services in other specified circumstances: Secondary | ICD-10-CM | POA: Diagnosis not present

## 2021-10-29 DIAGNOSIS — F40243 Fear of flying: Secondary | ICD-10-CM | POA: Diagnosis not present

## 2021-10-29 DIAGNOSIS — G4700 Insomnia, unspecified: Secondary | ICD-10-CM | POA: Diagnosis not present

## 2021-10-29 MED ORDER — ALPRAZOLAM 0.25 MG PO TABS: ORAL_TABLET | ORAL | 0 refills | Status: DC

## 2021-10-29 MED ORDER — TRAZODONE HCL 50 MG PO TABS: 50.0000 mg | ORAL_TABLET | Freq: Every day | ORAL | 0 refills | Status: DC

## 2021-10-29 NOTE — Telephone Encounter (Signed)
Medical record request faxed to [facility]

## 2021-10-29 NOTE — Progress Notes (Cosign Needed)
[facility] [address] [place], [place] [address]  Internal MEDICINE  Office Visit Note  Patient Name: Kimberly Romero  [id number]  [id number]  Date of Service: 10/29/2021   Complaints/HPI Pt is here for establishment of PCP. Chief Complaint  Patient presents with   New Patient (Initial Visit)   Skin Problem    Possible reaction to skincare product   HPI Patient is here to establish care -She reports she was married in the past year and her insurance changed so she needed to establish with a new PCP -No chronic medical conditions -She is currently taking ambien nightly, has been taking it for a few years, works well the first few hours but then wears off and mind is racing around 4 AM.  She is interested in getting off of Ambien and potentially trying an alternative until she is able to go without anything.  Further discussed active thinking techniques which she will try to help stop the ruminating -married with 2 boys, 43yo and 43yo -Never been a smoker, occasional alcohol, no other substance use -Self employed, does hair, has her own shop, works about 20hours per week -likes to travel, goes to kids' games -Exercises 5 hours per week, spin -Last annual, done via [facility], which is when she had an IUD put in place. She reports this was about a year ago and that she had her pap done at this time as well, she was told that her IUD is good for 5 years.  Reports her Pap was normal -She does state that they are about to travel will be taking a flight May 16th. this will be a 12 hour flight to [place] then to [place].  She does get some anxiety on plane rides and request to have something to help with this if needed on the plane.  She is taking Xanax before in the past and only needs it usually for longer plane rides does okay with short trips.  Discussed sending only a few tablets and to take half to 1 tablet only if needed -She reports that in the past few weeks  she has tried [employer] sunscreen and thinks that this is causing breakouts.  Advised that she stop this and try alternative sunscreen.  If breakouts continue or she would be interested in seeing dermatology  Current Medication: Outpatient Encounter Medications as of 10/29/2021  Medication Sig   ALPRAZolam (XANAX) 0.25 MG tablet Half to one tab tab as needed for plane ride   traZODone (DESYREL) 50 MG tablet Take 1 tablet (50 mg total) by mouth at bedtime.   zolpidem (AMBIEN) 10 MG tablet Take 10 mg by mouth at bedtime as needed for sleep.   No facility-administered encounter medications on file as of 10/29/2021.    Surgical History: Past Surgical History:  Procedure Laterality Date   CESAREAN SECTION      Medical History: Past Medical History:  Diagnosis Date   Thyroid disease     Family History: Family History  Problem Relation Age of Onset   Cancer Mother     Social History   Socioeconomic History   Marital status: Married    Spouse name: Not on file   Number of children: Not on file   Years of education: Not on file   Highest education level: Not on file  Occupational History   Not on file  Tobacco Use   Smoking status: Never   Smokeless tobacco: Never  Substance and Sexual Activity  Alcohol use: Yes   Drug use: Never   Sexual activity: Yes  Other Topics Concern   Not on file  Social History Narrative   Not on file   Social Determinants of Health   Financial Resource Strain: Not on file  Food Insecurity: Not on file  Transportation Needs: Not on file  Physical Activity: Not on file  Stress: Not on file  Social Connections: Not on file  Intimate Partner Violence: Not on file     Review of Systems  Constitutional:  Negative for chills, fatigue and unexpected weight change.  HENT:  Negative for congestion, postnasal drip, rhinorrhea, sneezing and sore throat.   Eyes:  Negative for redness.  Respiratory:  Negative for cough, chest tightness and shortness  of breath.   Cardiovascular:  Negative for chest pain and palpitations.  Gastrointestinal:  Negative for abdominal pain, constipation, diarrhea, nausea and vomiting.  Genitourinary:  Negative for dysuria and frequency.  Musculoskeletal:  Negative for arthralgias, back pain, joint swelling and neck pain.  Skin:        Breakout from sunscreen  Neurological: Negative.  Negative for tremors and numbness.  Hematological:  Negative for adenopathy. Does not bruise/bleed easily.  Psychiatric/Behavioral:  Positive for sleep disturbance. Negative for behavioral problems (Depression) and suicidal ideas. The patient is not nervous/anxious.    Vital Signs: BP 121/76    Pulse 69    Temp 98.1 F (36.7 C)    Resp 16    Ht  (1.6 m)    Wt 135 lb 6.4 oz (61.4 kg)    LMP  (LMP Unknown)    SpO2 99%    BMI 23.99 kg/m    Physical Exam Vitals and nursing note reviewed.  Constitutional:      General: She is not in acute distress.    Appearance: She is well-developed and normal weight. She is not diaphoretic.  HENT:     Head: Normocephalic and atraumatic.     Mouth/Throat:     Pharynx: No oropharyngeal exudate.  Eyes:     Pupils: Pupils are equal, round, and reactive to light.  Neck:     Thyroid: No thyromegaly.     Vascular: No JVD.     Trachea: No tracheal deviation.  Cardiovascular:     Rate and Rhythm: Normal rate and regular rhythm.     Heart sounds: Normal heart sounds. No murmur heard.   No friction rub. No gallop.  Pulmonary:     Effort: Pulmonary effort is normal. No respiratory distress.     Breath sounds: No wheezing or rales.  Chest:     Chest wall: No tenderness.  Abdominal:     General: Bowel sounds are normal.     Palpations: Abdomen is soft.  Musculoskeletal:        General: Normal range of motion.     Cervical back: Normal range of motion and neck supple.  Lymphadenopathy:     Cervical: No cervical adenopathy.  Skin:    General: Skin is warm and dry.  Neurological:      Mental Status: She is alert and oriented to person, place, and time.     Cranial Nerves: No cranial nerve deficit.  Psychiatric:        Behavior: Behavior normal.        Thought Content: Thought content normal.        Judgment: Judgment normal.      Assessment/Plan: 1. Insomnia, unspecified type We will stop Ambien and start  on trazodone.  May start with half to 1 tablet of trazodone before bed.  May need to titrate up in future.  Discussed not taking Ambien and trazodone at the same time.  A few hours before bed.  We will try active thinking techniques as well if she wakes up in the middle the night - traZODone (DESYREL) 50 MG tablet; Take 1 tablet (50 mg total) by mouth at bedtime.  Dispense: 90 tablet; Refill: 0  2. Anxiety with flying May take half to 1 tablet as needed prolonged plane rides - ALPRAZolam (XANAX) 0.25 MG tablet; Half to one tab tab as needed for plane ride  Dispense: 6 tablet; Refill: 0 [place] Controlled Substance Database was reviewed by me for overdose risk score (ORS)  3. Encounter to establish care with new doctor Patient is here to establish care, medical history reviewed, will schedule CPE after routine fasting labs - CBC w/Diff/Platelet - Comprehensive metabolic panel - TSH + free T4 - Lipid Panel With LDL/HDL Ratio  4. Other fatigue - CBC w/Diff/Platelet - Comprehensive metabolic panel - TSH + free T4 - Lipid Panel With LDL/HDL Ratio   General Counseling: Vallerie verbalizes understanding of the findings of todays visit and agrees with plan of treatment. I have discussed any further diagnostic evaluation that may be needed or ordered today. We also reviewed her medications today. she has been encouraged to call the office with any questions or concerns that should arise related to todays visit.    Counseling:    Orders Placed This Encounter  Procedures   CBC w/Diff/Platelet   Comprehensive metabolic panel   TSH + free T4   Lipid Panel With LDL/HDL  Ratio    Meds ordered this encounter  Medications   traZODone (DESYREL) 50 MG tablet    Sig: Take 1 tablet (50 mg total) by mouth at bedtime.    Dispense:  90 tablet    Refill:  0   ALPRAZolam (XANAX) 0.25 MG tablet    Sig: Half to one tab tab as needed for plane ride    Dispense:  6 tablet    Refill:  0     This patient was seen by Lynn Ito, PA-C in collaboration with Dr. Beverely Risen as a part of collaborative care agreement.   Time spent:35 Minutes

## 2021-11-26 ENCOUNTER — Encounter: Payer: Self-pay | Admitting: Certified Nurse Midwife

## 2021-11-26 ENCOUNTER — Other Ambulatory Visit: Payer: Self-pay | Admitting: Physician Assistant

## 2021-11-26 ENCOUNTER — Encounter: Payer: Self-pay | Admitting: Physician Assistant

## 2021-11-26 DIAGNOSIS — G4700 Insomnia, unspecified: Secondary | ICD-10-CM

## 2021-11-26 MED ORDER — MIRTAZAPINE 15 MG PO TABS: 15.0000 mg | ORAL_TABLET | Freq: Every day | ORAL | 1 refills | Status: DC

## 2022-01-15 ENCOUNTER — Other Ambulatory Visit: Payer: Self-pay | Admitting: Physician Assistant

## 2022-01-15 DIAGNOSIS — G4700 Insomnia, unspecified: Secondary | ICD-10-CM

## 2022-01-24 LAB — LIPID PANEL WITH LDL/HDL RATIO
Cholesterol, Total: 165 mg/dL (ref 100–199)
HDL: 75 mg/dL (ref >39)
LDL Chol Calc (NIH): 76 mg/dL (ref 0–99)
LDL/HDL Ratio: 1 ratio (ref 0.0–3.2)
Triglycerides: 73 mg/dL (ref 0–149)
VLDL Cholesterol Cal: 14 mg/dL (ref 5–40)

## 2022-01-24 LAB — CBC WITH DIFFERENTIAL/PLATELET
Basophils Absolute: 0.1 10*3/uL (ref 0.0–0.2)
Basos: 1 %
EOS (ABSOLUTE): 0 10*3/uL (ref 0.0–0.4)
Eos: 1 %
Hematocrit: 42.5 % (ref 34.0–46.6)
Hemoglobin: 14.5 g/dL (ref 11.1–15.9)
Immature Grans (Abs): 0 10*3/uL (ref 0.0–0.1)
Immature Granulocytes: 0 %
Lymphocytes Absolute: 1.7 10*3/uL (ref 0.7–3.1)
Lymphs: 27 %
MCH: 31.3 pg (ref 26.6–33.0)
MCHC: 34.1 g/dL (ref 31.5–35.7)
MCV: 92 fL (ref 79–97)
Monocytes Absolute: 0.4 10*3/uL (ref 0.1–0.9)
Monocytes: 6 %
Neutrophils Absolute: 4.3 10*3/uL (ref 1.4–7.0)
Neutrophils: 65 %
Platelets: 285 10*3/uL (ref 150–450)
RBC: 4.64 x10E6/uL (ref 3.77–5.28)
RDW: 11.9 % (ref 11.7–15.4)
WBC: 6.5 10*3/uL (ref 3.4–10.8)

## 2022-01-24 LAB — COMPREHENSIVE METABOLIC PANEL
ALT: 11 IU/L (ref 0–32)
AST: 11 IU/L (ref 0–40)
Albumin/Globulin Ratio: 1.7 (ref 1.2–2.2)
Albumin: 4.5 g/dL (ref 3.8–4.8)
Alkaline Phosphatase: 48 IU/L (ref 44–121)
BUN/Creatinine Ratio: 12 (ref 9–23)
BUN: 13 mg/dL (ref 6–24)
Bilirubin Total: 0.3 mg/dL (ref 0.0–1.2)
CO2: 25 mmol/L (ref 20–29)
Calcium: 9.4 mg/dL (ref 8.7–10.2)
Chloride: 101 mmol/L (ref 96–106)
Creatinine, Ser: 1.1 mg/dL — ABNORMAL HIGH (ref 0.57–1.00)
Globulin, Total: 2.6 g/dL (ref 1.5–4.5)
Glucose: 85 mg/dL (ref 70–99)
Potassium: 3.8 mmol/L (ref 3.5–5.2)
Sodium: 141 mmol/L (ref 134–144)
Total Protein: 7.1 g/dL (ref 6.0–8.5)
eGFR: 64 mL/min/{1.73_m2} (ref >59)

## 2022-01-24 LAB — TSH+FREE T4
Free T4: 1.5 ng/dL (ref 0.82–1.77)
TSH: 0.658 u[IU]/mL (ref 0.450–4.500)

## 2022-01-25 ENCOUNTER — Telehealth: Payer: Self-pay

## 2022-01-25 NOTE — Telephone Encounter (Signed)
Left vm and sent mychart message to confirm 02/01/22 appointment-Toni 

## 2022-02-01 ENCOUNTER — Encounter: Payer: Self-pay | Admitting: Physician Assistant

## 2022-02-01 ENCOUNTER — Ambulatory Visit (INDEPENDENT_AMBULATORY_CARE_PROVIDER_SITE_OTHER): Payer: BLUE CROSS/BLUE SHIELD | Admitting: Physician Assistant

## 2022-02-01 DIAGNOSIS — Z1231 Encounter for screening mammogram for malignant neoplasm of breast: Secondary | ICD-10-CM | POA: Diagnosis not present

## 2022-02-01 DIAGNOSIS — Z0001 Encounter for general adult medical examination with abnormal findings: Secondary | ICD-10-CM | POA: Diagnosis not present

## 2022-02-01 DIAGNOSIS — Z01419 Encounter for gynecological examination (general) (routine) without abnormal findings: Secondary | ICD-10-CM | POA: Diagnosis not present

## 2022-02-01 DIAGNOSIS — R7989 Other specified abnormal findings of blood chemistry: Secondary | ICD-10-CM | POA: Diagnosis not present

## 2022-02-01 DIAGNOSIS — R300 Dysuria: Secondary | ICD-10-CM

## 2022-02-01 DIAGNOSIS — G4700 Insomnia, unspecified: Secondary | ICD-10-CM

## 2022-02-01 NOTE — Progress Notes (Signed)
[facility] [place], [place] [address]  Internal MEDICINE  Office Visit Note  Patient Name: Kimberly Romero  [id number]  [id number]  Date of Service: 02/15/2022  Chief Complaint  Patient presents with   Annual Exam   Depression     HPI Pt is here for routine health maintenance examination  -Recently got back from traveling out of the country -Mirtazapine is helping her sleep and working well for her. She would like to continue -Labs reviewed and normal except for an elevated creatinine -Hx of kidney stones--found several years ago. Plan to recheck labs and consider [place] if still abnormal. -Sees OBGYN for IUD and pap -Due for Mammogram and will place order today  Current Medication: Outpatient Encounter Medications as of 02/01/2022  Medication Sig   ALPRAZolam (XANAX) 0.25 MG tablet Half to one tab tab as needed for plane ride   fluticasone (FLONASE) 50 MCG/ACT nasal spray Place 2 sprays into both nostrils daily.   HYDROcodone-acetaminophen (NORCO/VICODIN) 5-325 MG tablet Take 1 tablet by mouth every 6 (six) hours as needed.   levonorgestrel (MIRENA) 20 MCG/24HR IUD 1 each by Intrauterine route once.   mirtazapine (REMERON) 15 MG tablet TAKE ONE TABLET BY MOUTH AT BEDTIME   [DISCONTINUED] FLUoxetine (PROZAC) 40 MG capsule TAKE 1 CAPSULE(40 MG) BY MOUTH DAILY WITH BREAKFAST   No facility-administered encounter medications on file as of 02/01/2022.    Surgical History: Past Surgical History:  Procedure Laterality Date   AUGMENTATION MAMMAPLASTY Bilateral 2013   BREAST SURGERY     CESAREAN SECTION     X 2   CESAREAN SECTION      Medical History: Past Medical History:  Diagnosis Date   Anxiety    Depression    Disturbed sleep rhythm    Enlarged thyroid    History of hypothyroidism    Kidney stones    Thyroid disease    Vaginal Pap smear, abnormal    years ago    Family History: Family History  Problem Relation Age of Onset   Cancer  Mother    Cancer Maternal Grandfather        colon   Prostate cancer Neg Hx    Kidney cancer Neg Hx    Breast cancer Neg Hx    Ovarian cancer Neg Hx    Colon cancer Neg Hx    Mental illness Neg Hx       Review of Systems  Constitutional:  Negative for chills, fatigue and unexpected weight change.  HENT:  Negative for congestion, postnasal drip, rhinorrhea, sneezing and sore throat.   Eyes:  Negative for redness.  Respiratory:  Negative for cough, chest tightness and shortness of breath.   Cardiovascular:  Negative for chest pain and palpitations.  Gastrointestinal:  Negative for abdominal pain, constipation, diarrhea, nausea and vomiting.  Genitourinary:  Negative for dysuria and frequency.  Musculoskeletal:  Negative for arthralgias, back pain, joint swelling and neck pain.  Skin:  Negative for rash.  Neurological: Negative.  Negative for tremors and numbness.  Hematological:  Negative for adenopathy. Does not bruise/bleed easily.  Psychiatric/Behavioral:  Negative for behavioral problems (Depression), sleep disturbance and suicidal ideas. The patient is not nervous/anxious.      Vital Signs: BP 124/76   Pulse 75   Temp 98.5 F (36.9 C)   Resp 16   Ht 5' 3"  (1.6 m)   Wt 139 lb 3.2 oz (63.1 kg)   SpO2 99%   BMI 24.66 kg/m  Physical Exam Vitals and nursing note reviewed.  Constitutional:      General: She is not in acute distress.    Appearance: Normal appearance. She is well-developed and normal weight. She is not diaphoretic.  HENT:     Head: Normocephalic and atraumatic.     Mouth/Throat:     Pharynx: No oropharyngeal exudate.  Eyes:     Pupils: Pupils are equal, round, and reactive to light.  Neck:     Thyroid: No thyromegaly.     Vascular: No JVD.     Trachea: No tracheal deviation.  Cardiovascular:     Rate and Rhythm: Normal rate and regular rhythm.     Heart sounds: Normal heart sounds. No murmur heard.    No friction rub. No gallop.  Pulmonary:      Effort: Pulmonary effort is normal. No respiratory distress.     Breath sounds: No wheezing or rales.  Chest:     Chest wall: No tenderness.  Breasts:    Right: Normal. No mass or tenderness.     Left: Normal. No mass or tenderness.  Abdominal:     General: Bowel sounds are normal.     Palpations: Abdomen is soft.     Tenderness: There is no abdominal tenderness.  Musculoskeletal:        General: Normal range of motion.     Cervical back: Normal range of motion and neck supple.  Lymphadenopathy:     Cervical: No cervical adenopathy.  Skin:    General: Skin is warm and dry.  Neurological:     Mental Status: She is alert and oriented to person, place, and time.     Cranial Nerves: No cranial nerve deficit.  Psychiatric:        Behavior: Behavior normal.        Thought Content: Thought content normal.        Judgment: Judgment normal.      LABS: Recent Results (from the past 2160 hour(s))  CBC w/Diff/Platelet     Status: None   Collection Time: 01/23/22  2:25 PM  Result Value Ref Range   WBC 6.5 3.4 - 10.8 x10E3/uL   RBC 4.64 3.77 - 5.28 x10E6/uL   Hemoglobin 14.5 11.1 - 15.9 g/dL   Hematocrit 42.5 34.0 - 46.6 %   MCV 92 79 - 97 fL   MCH 31.3 26.6 - 33.0 pg   MCHC 34.1 31.5 - 35.7 g/dL   RDW 11.9 11.7 - 15.4 %   Platelets 285 150 - 450 x10E3/uL   Neutrophils 65 Not Estab. %   Lymphs 27 Not Estab. %   Monocytes 6 Not Estab. %   Eos 1 Not Estab. %   Basos 1 Not Estab. %   Neutrophils Absolute 4.3 1.4 - 7.0 x10E3/uL   Lymphocytes Absolute 1.7 0.7 - 3.1 x10E3/uL   Monocytes Absolute 0.4 0.1 - 0.9 x10E3/uL   EOS (ABSOLUTE) 0.0 0.0 - 0.4 x10E3/uL   Basophils Absolute 0.1 0.0 - 0.2 x10E3/uL   Immature Granulocytes 0 Not Estab. %   Immature Grans (Abs) 0.0 0.0 - 0.1 x10E3/uL  Comprehensive metabolic panel     Status: Abnormal   Collection Time: 01/23/22  2:25 PM  Result Value Ref Range   Glucose 85 70 - 99 mg/dL   BUN 13 6 - 24 mg/dL   Creatinine, Ser 1.10 (H)  0.57 - 1.00 mg/dL   eGFR 64 >59 mL/min/1.73   BUN/Creatinine Ratio 12 9 - 23   Sodium  141 134 - 144 mmol/L   Potassium 3.8 3.5 - 5.2 mmol/L   Chloride 101 96 - 106 mmol/L   CO2 25 20 - 29 mmol/L   Calcium 9.4 8.7 - 10.2 mg/dL   Total Protein 7.1 6.0 - 8.5 g/dL   Albumin 4.5 3.8 - 4.8 g/dL   Globulin, Total 2.6 1.5 - 4.5 g/dL   Albumin/Globulin Ratio 1.7 1.2 - 2.2   Bilirubin Total 0.3 0.0 - 1.2 mg/dL   Alkaline Phosphatase 48 44 - 121 IU/L   AST 11 0 - 40 IU/L   ALT 11 0 - 32 IU/L  TSH + free T4     Status: None   Collection Time: 01/23/22  2:25 PM  Result Value Ref Range   TSH 0.658 0.450 - 4.500 uIU/mL   Free T4 1.50 0.82 - 1.77 ng/dL  Lipid Panel With LDL/HDL Ratio     Status: None   Collection Time: 01/23/22  2:25 PM  Result Value Ref Range   Cholesterol, Total 165 100 - 199 mg/dL   Triglycerides 73 0 - 149 mg/dL   HDL 75 >39 mg/dL   VLDL Cholesterol Cal 14 5 - 40 mg/dL   LDL Chol Calc (NIH) 76 0 - 99 mg/dL   LDL/HDL Ratio 1.0 0.0 - 3.2 ratio    Comment:                                     LDL/HDL Ratio                                             Men  Women                               1/2 Avg.Risk  1.0    1.5                                   Avg.Risk  3.6    3.2                                2X Avg.Risk  6.2    5.0                                3X Avg.Risk  8.0    6.1   UA/M w/rflx Culture, Routine     Status: Abnormal   Collection Time: 02/01/22 11:15 AM   Specimen: Urine   Urine  Result Value Ref Range   Specific Gravity, UA 1.015 1.005 - 1.030   pH, UA 6.0 5.0 - 7.5   Color, UA Yellow Yellow   Appearance Ur Clear Clear   Leukocytes,UA 2+ (A) Negative   Protein,UA Negative Negative/Trace   Glucose, UA Negative Negative   Ketones, UA Negative Negative   RBC, UA Trace (A) Negative   Bilirubin, UA Negative Negative   Urobilinogen, Ur 0.2 0.2 - 1.0 mg/dL   Nitrite, UA Positive (A) Negative   Microscopic Examination See below:     Comment: Microscopic was  indicated and was performed.  Urinalysis Reflex Comment     Comment: This specimen has reflexed to a Urine Culture.  Microscopic Examination     Status: Abnormal   Collection Time: 02/01/22 11:15 AM   Urine  Result Value Ref Range   WBC, UA 11-30 (A) 0 - 5 /hpf   RBC, Urine 3-10 (A) 0 - 2 /hpf   Epithelial Cells (non renal) 0-10 0 - 10 /hpf   Casts None seen None seen /lpf   Bacteria, UA Few None seen/Few  Urine Culture, Reflex     Status: Abnormal   Collection Time: 02/01/22 11:15 AM   Urine  Result Value Ref Range   Urine Culture, Routine Final report (A)    Organism ID, Bacteria Escherichia coli (A)     Comment: Multi-Drug Resistant Organism Susceptibility profile is consistent with a probable ESBL. Greater than 100,000 colony forming units per mL    ORGANISM ID, BACTERIA Comment     Comment: Mixed urogenital flora 10,000-25,000 colony forming units per mL    Antimicrobial Susceptibility Comment     Comment:       ** S = Susceptible; I = Intermediate; R = Resistant **                    P = Positive; N = Negative             MICS are expressed in micrograms per mL    Antibiotic                 RSLT#1    RSLT#2    RSLT#3    RSLT#4 Amoxicillin/Clavulanic Acid    I Ampicillin                     R Cefazolin                      R Cefepime                       S Ceftriaxone                    R Cefuroxime                     R Ciprofloxacin                  R Ertapenem                      S Gentamicin                     S Imipenem                       S Levofloxacin                   R Meropenem                      S Nitrofurantoin                 S Piperacillin/Tazobactam        S Tetracycline                   R Tobramycin                     I Trimethoprim/Sulfa  R         Assessment/Plan: 1. Encounter for general adult medical examination with abnormal findings CPE performed, routine labs reviewed, Mammogram ordered. Pap UTD via OBGYN  2.  Insomnia, unspecified type May continue mirtazapine   3. Elevated serum creatinine Will recheck labs prior to next visit. If still abnormal may need renal US - Comprehensive metabolic panel  4. Visit for screening mammogram - MM 3D SCREEN BREAST W/IMPLANT BILATERAL; Future  5. Visit for gynecologic examination Breast exam performed  6. Dysuria - UA/M w/rflx Culture, Routine   General Counseling: Kimberly Romero verbalizes understanding of the findings of todays visit and agrees with plan of treatment. I have discussed any further diagnostic evaluation that may be needed or ordered today. We also reviewed her medications today. she has been encouraged to call the office with any questions or concerns that should arise related to todays visit.    Counseling:    Orders Placed This Encounter  Procedures   Microscopic Examination   Urine Culture, Reflex   MM 3D SCREEN BREAST W/IMPLANT BILATERAL   UA/M w/rflx Culture, Routine   Comprehensive metabolic panel    No orders of the defined types were placed in this encounter.   This patient was seen by Drema Dallas, PA-C in collaboration with Dr. Clayborn Bigness as a part of collaborative care agreement.  Total time spent:35 Minutes  Time spent includes review of chart, medications, test results, and follow up plan with the patient.     Lavera Guise, MD  Internal Medicine

## 2022-02-05 ENCOUNTER — Other Ambulatory Visit: Payer: Self-pay | Admitting: Physician Assistant

## 2022-02-05 ENCOUNTER — Encounter: Payer: Self-pay | Admitting: Physician Assistant

## 2022-02-05 DIAGNOSIS — N3001 Acute cystitis with hematuria: Secondary | ICD-10-CM

## 2022-02-05 LAB — UA/M W/RFLX CULTURE, ROUTINE
Bilirubin, UA: NEGATIVE
Glucose, UA: NEGATIVE
Ketones, UA: NEGATIVE
Nitrite, UA: POSITIVE — AB
Protein,UA: NEGATIVE
Specific Gravity, UA: 1.015 (ref 1.005–1.030)
Urobilinogen, Ur: 0.2 mg/dL (ref 0.2–1.0)
pH, UA: 6 (ref 5.0–7.5)

## 2022-02-05 LAB — MICROSCOPIC EXAMINATION: Casts: NONE SEEN /lpf

## 2022-02-05 LAB — URINE CULTURE, REFLEX

## 2022-02-05 MED ORDER — NITROFURANTOIN MONOHYD MACRO 100 MG PO CAPS: ORAL_CAPSULE | ORAL | 0 refills | Status: DC

## 2022-02-06 NOTE — Telephone Encounter (Signed)
Spoke with pt about test result and advised her that we send antibiotic that will help her and we can repeat UA at next visit

## 2022-03-15 ENCOUNTER — Other Ambulatory Visit: Payer: Self-pay

## 2022-03-15 ENCOUNTER — Encounter: Payer: Self-pay | Admitting: Physician Assistant

## 2022-03-15 DIAGNOSIS — G4700 Insomnia, unspecified: Secondary | ICD-10-CM

## 2022-03-15 MED ORDER — MIRTAZAPINE 15 MG PO TABS: 15.0000 mg | ORAL_TABLET | Freq: Every day | ORAL | 1 refills | Status: DC

## 2022-03-15 NOTE — Telephone Encounter (Signed)
Pt called for refill to [facility] to [facility].  I advised pt to call [facility] and have [facility] send to [facility] and pt agreed.  I changed [facility] to [facility] per patient

## 2022-03-28 ENCOUNTER — Encounter: Payer: Self-pay | Admitting: Physician Assistant

## 2022-03-28 ENCOUNTER — Ambulatory Visit (INDEPENDENT_AMBULATORY_CARE_PROVIDER_SITE_OTHER): Payer: BLUE CROSS/BLUE SHIELD | Admitting: Physician Assistant

## 2022-03-28 VITALS — BP 120/78 | HR 69 | Temp 98.5°F | Resp 16 | Ht 63.0 in | Wt 145.0 lb

## 2022-03-28 DIAGNOSIS — R4586 Emotional lability: Secondary | ICD-10-CM | POA: Diagnosis not present

## 2022-03-28 DIAGNOSIS — R300 Dysuria: Secondary | ICD-10-CM

## 2022-03-28 DIAGNOSIS — G4700 Insomnia, unspecified: Secondary | ICD-10-CM

## 2022-03-28 LAB — POCT URINALYSIS DIPSTICK
Bilirubin, UA: NEGATIVE
Blood, UA: NEGATIVE
Glucose, UA: NEGATIVE
Ketones, UA: NEGATIVE
Leukocytes, UA: NEGATIVE
Nitrite, UA: NEGATIVE
Protein, UA: NEGATIVE
Spec Grav, UA: 1.01
Urobilinogen, UA: 0.2 U/dL
pH, UA: 6.5

## 2022-03-28 LAB — POCT PREGNANCY, URINE

## 2022-03-28 MED ORDER — BUPROPION HCL ER (XL) 150 MG PO TB24: 150.0000 mg | ORAL_TABLET | Freq: Every day | ORAL | 2 refills | Status: DC

## 2022-03-28 MED ORDER — MIRTAZAPINE 15 MG PO TABS: 15.0000 mg | ORAL_TABLET | Freq: Every day | ORAL | 1 refills | Status: DC

## 2022-03-28 NOTE — Progress Notes (Signed)
[facility] [address] [place], [place] [address]  Internal MEDICINE  Office Visit Note  Patient Name: Kimberly Romero  [id number]  [id number]  Date of Service: 03/28/2022  Chief Complaint  Patient presents with   Follow-up   Depression    Having changes in mood - pt thinks possibly pre-menopause   Anxiety   Quality Metric Gaps    Tetanus Vaccine    HPI Pt is here for follow up to discuss mood changes -Rechecked urine after having UTI and appears clear. Does states she had a little pressure and right side low back pain yesterday, therefore will still send for culture to ensure it is cleared. -She has been experiencing mood swings over the past 4 months and seem to be getting worse.  -did have 1 anxiety attack few weeks ago, trouble catching her breath. This was first time in a long time. -Denies any recent changes or stressors. She states her life is good and she doesn't understand why these mood swings are happening as she feels fine one day and depressed the next without anything changing. Denies SI/HI -Has been gaining some weight as well. States she hasnt been this heavy since having kids. Concerned she may be premenopausal after speaking with her mom who mentioned mood swings as an early symptom of this. Denies any hot flashes -Due to weight gain, some pelvic pressure and mood swings, will check urine pregnancy test even though patient does have IUD in place -Did discuss weight gain could be side effect of mirtazapine and could try cutting dose in half, but she is sleeping great on this and is very happy with her sleep quality since being on this  Current Medication: Outpatient Encounter Medications as of 03/28/2022  Medication Sig   ALPRAZolam (XANAX) 0.25 MG tablet Half to one tab tab as needed for plane ride   buPROPion (WELLBUTRIN XL) 150 MG 24 hr tablet Take 1 tablet (150 mg total) by mouth daily.   fluticasone (FLONASE) 50 MCG/ACT nasal spray Place 2 sprays  into both nostrils daily.   HYDROcodone-acetaminophen (NORCO/VICODIN) 5-325 MG tablet Take 1 tablet by mouth every 6 (six) hours as needed.   levonorgestrel (MIRENA) 20 MCG/24HR IUD 1 each by Intrauterine route once.   [DISCONTINUED] mirtazapine (REMERON) 15 MG tablet Take 1 tablet (15 mg total) by mouth at bedtime.   [DISCONTINUED] nitrofurantoin, macrocrystal-monohydrate, (MACROBID) 100 MG capsule Take 1 cap twice per day for 10 days.   mirtazapine (REMERON) 15 MG tablet Take 1 tablet (15 mg total) by mouth at bedtime.   No facility-administered encounter medications on file as of 03/28/2022.    Surgical History: Past Surgical History:  Procedure Laterality Date   AUGMENTATION MAMMAPLASTY Bilateral 2013   BREAST SURGERY     CESAREAN SECTION     X 2   CESAREAN SECTION      Medical History: Past Medical History:  Diagnosis Date   Anxiety    Depression    Disturbed sleep rhythm    Enlarged thyroid    History of hypothyroidism    Kidney stones    Thyroid disease    Vaginal Pap smear, abnormal    years ago    Family History: Family History  Problem Relation Age of Onset   Cancer Mother    Cancer Maternal Grandfather        colon   Prostate cancer Neg Hx    Kidney cancer Neg Hx    Breast cancer Neg Hx  Ovarian cancer Neg Hx    Colon cancer Neg Hx    Mental illness Neg Hx     Social History   Socioeconomic History   Marital status: Married    Spouse name: Not on file   Number of children: Not on file   Years of education: Not on file   Highest education level: Not on file  Occupational History   Not on file  Tobacco Use   Smoking status: Never   Smokeless tobacco: Never  Vaping Use   Vaping Use: Never used  Substance and Sexual Activity   Alcohol use: Yes    Comment: Occasional    Drug use: Never   Sexual activity: Yes    Birth control/protection: I.U.D.    Comment: Mirena  Other Topics Concern   Not on file  Social History Narrative   ** Merged  History Encounter **       Social Determinants of Health   Financial Resource Strain: Not on file  Food Insecurity: Not on file  Transportation Needs: Not on file  Physical Activity: Not on file  Stress: Not on file  Social Connections: Not on file  Intimate Partner Violence: Not on file      Review of Systems  Constitutional:  Negative for chills, fatigue and unexpected weight change.  HENT:  Negative for congestion, postnasal drip, rhinorrhea, sneezing and sore throat.   Eyes:  Negative for redness.  Respiratory:  Negative for cough, chest tightness and shortness of breath.   Cardiovascular:  Negative for chest pain and palpitations.  Gastrointestinal:  Negative for abdominal pain, constipation, diarrhea, nausea and vomiting.  Genitourinary:  Negative for dysuria and frequency.  Musculoskeletal:  Negative for arthralgias, back pain, joint swelling and neck pain.  Skin:  Negative for rash.  Neurological: Negative.  Negative for tremors and numbness.  Hematological:  Negative for adenopathy. Does not bruise/bleed easily.  Psychiatric/Behavioral:  Positive for dysphoric mood. Negative for behavioral problems (Depression), sleep disturbance and suicidal ideas. The patient is nervous/anxious.     Vital Signs: BP 120/78   Pulse 69   Temp 98.5 F (36.9 C)   Resp 16   Ht 5\' 3"  (1.6 m)   Wt 145 lb (65.8 kg)   SpO2 99%   BMI 25.69 kg/m    Physical Exam Vitals and nursing note reviewed.  Constitutional:      General: She is not in acute distress.    Appearance: She is well-developed and normal weight. She is not diaphoretic.  HENT:     Head: Normocephalic and atraumatic.     Mouth/Throat:     Pharynx: No oropharyngeal exudate.  Eyes:     Pupils: Pupils are equal, round, and reactive to light.  Neck:     Thyroid: No thyromegaly.     Vascular: No JVD.     Trachea: No tracheal deviation.  Cardiovascular:     Rate and Rhythm: Normal rate and regular rhythm.     Heart  sounds: Normal heart sounds. No murmur heard.    No friction rub. No gallop.  Pulmonary:     Effort: Pulmonary effort is normal. No respiratory distress.     Breath sounds: No wheezing or rales.  Chest:     Chest wall: No tenderness.  Abdominal:     General: Bowel sounds are normal.     Palpations: Abdomen is soft.  Musculoskeletal:        General: Normal range of motion.     Cervical  back: Normal range of motion and neck supple.  Lymphadenopathy:     Cervical: No cervical adenopathy.  Skin:    General: Skin is warm and dry.  Neurological:     Mental Status: She is alert and oriented to person, place, and time.     Cranial Nerves: No cranial nerve deficit.  Psychiatric:        Behavior: Behavior normal.        Thought Content: Thought content normal.        Judgment: Judgment normal.        Assessment/Plan: 1. Mood swings - POCT Pregnancy, Urine is negative. Will start on wellbutrin to help with mood changes as well as help with weight after recent gain. Contact office if any problems prior to 4 week follow up  2. Insomnia, unspecified type May try cutting down to 1/2 tab mirtazapine to help reduce possible weight gain side effects - mirtazapine (REMERON) 15 MG tablet; Take 1 tablet (15 mg total) by mouth at bedtime.  Dispense: 30 tablet; Refill: 1  3. Dysuria - POCT Urinalysis Dipstick -Will double check urine culture   General Counseling: Rayelle verbalizes understanding of the findings of todays visit and agrees with plan of treatment. I have discussed any further diagnostic evaluation that may be needed or ordered today. We also reviewed her medications today. she has been encouraged to call the office with any questions or concerns that should arise related to todays visit.    Orders Placed This Encounter  Procedures   CULTURE, URINE COMPREHENSIVE   POCT Urinalysis Dipstick    Meds ordered this encounter  Medications   buPROPion (WELLBUTRIN XL) 150 MG 24 hr  tablet    Sig: Take 1 tablet (150 mg total) by mouth daily.    Dispense:  30 tablet    Refill:  2   mirtazapine (REMERON) 15 MG tablet    Sig: Take 1 tablet (15 mg total) by mouth at bedtime.    Dispense:  30 tablet    Refill:  1    This patient was seen by Lynn Ito, PA-C in collaboration with Dr. Beverely Risen as a part of collaborative care agreement.   Total time spent:30 Minutes Time spent includes review of chart, medications, test results, and follow up plan with the patient.      Dr Lyndon Code Internal medicine

## 2022-04-01 ENCOUNTER — Telehealth: Payer: Self-pay

## 2022-04-01 LAB — CULTURE, URINE COMPREHENSIVE

## 2022-04-01 MED ORDER — NITROFURANTOIN MONOHYD MACRO 100 MG PO CAPS: 100.0000 mg | ORAL_CAPSULE | Freq: Two times a day (BID) | ORAL | 0 refills | Status: DC

## 2022-04-01 NOTE — Telephone Encounter (Signed)
Spoke to pt and informed her that she has a UTI and per Lauren we sent Macrobid 100 mg BID for 10 days to [facility].the patient understood

## 2022-04-01 NOTE — Telephone Encounter (Signed)
-----   Message from Carlean Jews, PA-C sent at 04/01/2022  3:57 PM EDT ----- Please let her know she does have another UTI and send macrobid BID x 10days to [facility] please

## 2022-04-04 ENCOUNTER — Other Ambulatory Visit: Payer: Self-pay | Admitting: Physician Assistant

## 2022-04-04 DIAGNOSIS — G4700 Insomnia, unspecified: Secondary | ICD-10-CM

## 2022-04-04 MED ORDER — RAMELTEON 8 MG PO TABS: 8.0000 mg | ORAL_TABLET | Freq: Every day | ORAL | 0 refills | Status: DC

## 2022-04-16 ENCOUNTER — Other Ambulatory Visit: Payer: Self-pay | Admitting: Physician Assistant

## 2022-04-16 DIAGNOSIS — G4700 Insomnia, unspecified: Secondary | ICD-10-CM

## 2022-04-16 MED ORDER — AMITRIPTYLINE HCL 10 MG PO TABS: 10.0000 mg | ORAL_TABLET | Freq: Every day | ORAL | 0 refills | Status: DC

## 2022-04-18 ENCOUNTER — Ambulatory Visit: Payer: BLUE CROSS/BLUE SHIELD | Admitting: Physician Assistant

## 2022-05-02 ENCOUNTER — Ambulatory Visit: Payer: BLUE CROSS/BLUE SHIELD | Admitting: Physician Assistant

## 2022-05-06 ENCOUNTER — Ambulatory Visit (INDEPENDENT_AMBULATORY_CARE_PROVIDER_SITE_OTHER): Payer: BLUE CROSS/BLUE SHIELD | Admitting: Physician Assistant

## 2022-05-06 ENCOUNTER — Encounter: Payer: Self-pay | Admitting: Physician Assistant

## 2022-05-06 DIAGNOSIS — R300 Dysuria: Secondary | ICD-10-CM

## 2022-05-06 DIAGNOSIS — R7989 Other specified abnormal findings of blood chemistry: Secondary | ICD-10-CM | POA: Diagnosis not present

## 2022-05-06 DIAGNOSIS — G4700 Insomnia, unspecified: Secondary | ICD-10-CM

## 2022-05-06 DIAGNOSIS — R4586 Emotional lability: Secondary | ICD-10-CM | POA: Diagnosis not present

## 2022-05-06 LAB — POCT URINALYSIS DIPSTICK
Bilirubin, UA: NEGATIVE
Glucose, UA: NEGATIVE
Leukocytes, UA: NEGATIVE
Nitrite, UA: NEGATIVE
Protein, UA: NEGATIVE
Spec Grav, UA: 1.01 (ref 1.010–1.025)
Urobilinogen, UA: 0.2 E.U./dL
pH, UA: 5 (ref 5.0–8.0)

## 2022-05-06 MED ORDER — BUPROPION HCL ER (XL) 300 MG PO TB24: 300.0000 mg | ORAL_TABLET | Freq: Every day | ORAL | 1 refills | Status: DC

## 2022-05-06 MED ORDER — MIRTAZAPINE 15 MG PO TABS: 15.0000 mg | ORAL_TABLET | Freq: Every day | ORAL | 1 refills | Status: DC

## 2022-05-06 NOTE — Progress Notes (Signed)
[facility] [address] [place], [place] [address]  Internal MEDICINE  Office Visit Note  Patient Name: Kimberly Romero  [id number]  [id number]  Date of Service: 05/06/2022  Chief Complaint  Patient presents with   Follow-up   Depression    HPI Pt is here for routine follow up -Doing so much better on wellbutrin but has started taking 2 tabs and feels energized and mood stabilized now. Would like to go up to the 300mg  tablet and will send today -Ramelteon and amitriptyline both didn't help sleep so went back to mirtazapine since this worked originally, but was trying alternative due to weight gain. Since being on wellbutrin since last visit she is down 5lbs and may offset the mirtazapine S/E -Would like to recheck urine due to 2 UTI last 2 visits. Does not have menstrual cycles due to IUD therefore blood seen in urine not from this. Will send another culture and if UTI still present will refer to urology due to chronic recurrent UTIs -Will also recheck CMP for kidney function as creatinine elevated previously--may be due to UTIs. Does have hx of kidney stones as well in past, but no flank pain today  Current Medication: Outpatient Encounter Medications as of 05/06/2022  Medication Sig   ALPRAZolam (XANAX) 0.25 MG tablet Half to one tab tab as needed for plane ride   buPROPion (WELLBUTRIN XL) 300 MG 24 hr tablet Take 1 tablet (300 mg total) by mouth daily.   fluticasone (FLONASE) 50 MCG/ACT nasal spray Place 2 sprays into both nostrils daily.   HYDROcodone-acetaminophen (NORCO/VICODIN) 5-325 MG tablet Take 1 tablet by mouth every 6 (six) hours as needed.   levonorgestrel (MIRENA) 20 MCG/24HR IUD 1 each by Intrauterine route once.   mirtazapine (REMERON) 15 MG tablet Take 1 tablet (15 mg total) by mouth at bedtime.   [DISCONTINUED] amitriptyline (ELAVIL) 10 MG tablet Take 1 tablet (10 mg total) by mouth at bedtime.   [DISCONTINUED] buPROPion (WELLBUTRIN XL) 150 MG 24 hr  tablet Take 1 tablet (150 mg total) by mouth daily.   [DISCONTINUED] nitrofurantoin, macrocrystal-monohydrate, (MACROBID) 100 MG capsule Take 1 capsule (100 mg total) by mouth 2 (two) times daily. For 10 days   [DISCONTINUED] ramelteon (ROZEREM) 8 MG tablet Take 1 tablet (8 mg total) by mouth at bedtime.   No facility-administered encounter medications on file as of 05/06/2022.    Surgical History: Past Surgical History:  Procedure Laterality Date   AUGMENTATION MAMMAPLASTY Bilateral 2013   BREAST SURGERY     CESAREAN SECTION     X 2   CESAREAN SECTION      Medical History: Past Medical History:  Diagnosis Date   Anxiety    Depression    Disturbed sleep rhythm    Enlarged thyroid    History of hypothyroidism    Kidney stones    Thyroid disease    Vaginal Pap smear, abnormal    years ago    Family History: Family History  Problem Relation Age of Onset   Cancer Mother    Cancer Maternal Grandfather        colon   Prostate cancer Neg Hx    Kidney cancer Neg Hx    Breast cancer Neg Hx    Ovarian cancer Neg Hx    Colon cancer Neg Hx    Mental illness Neg Hx     Social History   Socioeconomic History   Marital status: Married    Spouse name: Not on file  Number of children: Not on file   Years of education: Not on file   Highest education level: Not on file  Occupational History   Not on file  Tobacco Use   Smoking status: Never   Smokeless tobacco: Never  Vaping Use   Vaping Use: Never used  Substance and Sexual Activity   Alcohol use: Yes    Comment: Occasional    Drug use: Never   Sexual activity: Yes    Birth control/protection: I.U.D.    Comment: Mirena  Other Topics Concern   Not on file  Social History Narrative   ** Merged History Encounter **       Social Determinants of Health   Financial Resource Strain: Not on file  Food Insecurity: Not on file  Transportation Needs: Not on file  Physical Activity: Not on file  Stress: Not on file   Social Connections: Not on file  Intimate Partner Violence: Not on file      Review of Systems  Constitutional:  Negative for chills, fatigue and unexpected weight change.  HENT:  Negative for congestion, postnasal drip, rhinorrhea, sneezing and sore throat.   Eyes:  Negative for redness.  Respiratory:  Negative for cough, chest tightness and shortness of breath.   Cardiovascular:  Negative for chest pain and palpitations.  Gastrointestinal:  Negative for abdominal pain, constipation, diarrhea, nausea and vomiting.  Genitourinary:  Negative for dysuria and frequency.  Musculoskeletal:  Negative for arthralgias, back pain, joint swelling and neck pain.  Skin:  Negative for rash.  Neurological: Negative.  Negative for tremors and numbness.  Hematological:  Negative for adenopathy. Does not bruise/bleed easily.  Psychiatric/Behavioral:  Negative for behavioral problems (Depression), sleep disturbance and suicidal ideas. The patient is not nervous/anxious.     Vital Signs: BP 136/88 Comment: 137/93  Pulse 98   Temp 98.5 F (36.9 C)   Resp 16   Ht 5\' 3"  (1.6 m)   Wt 140 lb 3.2 oz (63.6 kg)   SpO2 100%   BMI 24.84 kg/m    Physical Exam Vitals and nursing note reviewed.  Constitutional:      General: She is not in acute distress.    Appearance: Normal appearance. She is well-developed and normal weight. She is not diaphoretic.  HENT:     Head: Normocephalic and atraumatic.     Mouth/Throat:     Pharynx: No oropharyngeal exudate.  Eyes:     Pupils: Pupils are equal, round, and reactive to light.  Neck:     Thyroid: No thyromegaly.     Vascular: No JVD.     Trachea: No tracheal deviation.  Cardiovascular:     Rate and Rhythm: Normal rate and regular rhythm.     Heart sounds: Normal heart sounds. No murmur heard.    No friction rub. No gallop.  Pulmonary:     Effort: Pulmonary effort is normal.  Abdominal:     General: Bowel sounds are normal.     Palpations: Abdomen  is soft.     Tenderness: There is no abdominal tenderness.  Musculoskeletal:        General: Normal range of motion.     Cervical back: Normal range of motion and neck supple.  Lymphadenopathy:     Cervical: No cervical adenopathy.  Skin:    General: Skin is warm and dry.  Neurological:     Mental Status: She is alert and oriented to person, place, and time.     Cranial Nerves: No  cranial nerve deficit.  Psychiatric:        Behavior: Behavior normal.        Thought Content: Thought content normal.        Judgment: Judgment normal.        Assessment/Plan: 1. Mood swings Greatly improved taking 2 tablets of the 150mg  dose, will send 300mg  script - buPROPion (WELLBUTRIN XL) 300 MG 24 hr tablet; Take 1 tablet (300 mg total) by mouth daily.  Dispense: 90 tablet; Refill: 1  2. Insomnia, unspecified type May continue mirtazapine, may try cutting in half to see if still effective while minimizing weight S/E - mirtazapine (REMERON) 15 MG tablet; Take 1 tablet (15 mg total) by mouth at bedtime.  Dispense: 90 tablet; Refill: 1  3. Elevated serum creatinine Will recheck labs  4. Dysuria Blood seen again and has had bacteria on past 2 cultures, will recheck culture and if abnormal again may need urology referral - POCT Urinalysis Dipstick   General Counseling: Amy verbalizes understanding of the findings of todays visit and agrees with plan of treatment. I have discussed any further diagnostic evaluation that may be needed or ordered today. We also reviewed her medications today. she has been encouraged to call the office with any questions or concerns that should arise related to todays visit.    Orders Placed This Encounter  Procedures   CULTURE, URINE COMPREHENSIVE   POCT Urinalysis Dipstick    Meds ordered this encounter  Medications   buPROPion (WELLBUTRIN XL) 300 MG 24 hr tablet    Sig: Take 1 tablet (300 mg total) by mouth daily.    Dispense:  90 tablet    Refill:   1   mirtazapine (REMERON) 15 MG tablet    Sig: Take 1 tablet (15 mg total) by mouth at bedtime.    Dispense:  90 tablet    Refill:  1    This patient was seen by Drema Dallas, PA-C in collaboration with Dr. Clayborn Bigness as a part of collaborative care agreement.   Total time spent:30 Minutes Time spent includes review of chart, medications, test results, and follow up plan with the patient.      Dr Lavera Guise Internal medicine

## 2022-05-13 ENCOUNTER — Other Ambulatory Visit: Payer: Self-pay | Admitting: Physician Assistant

## 2022-05-13 ENCOUNTER — Telehealth: Payer: Self-pay

## 2022-05-13 ENCOUNTER — Telehealth: Payer: Self-pay | Admitting: Physician Assistant

## 2022-05-13 DIAGNOSIS — N390 Urinary tract infection, site not specified: Secondary | ICD-10-CM

## 2022-05-13 LAB — CULTURE, URINE COMPREHENSIVE

## 2022-05-13 MED ORDER — NITROFURANTOIN MONOHYD MACRO 100 MG PO CAPS: ORAL_CAPSULE | ORAL | 0 refills | Status: DC

## 2022-05-13 NOTE — Telephone Encounter (Signed)
Urology referral faxed to [facility] 

## 2022-05-13 NOTE — Telephone Encounter (Signed)
Spoke with patient regarding urine culture results and notified patient that Urology referral has been sent + Macrobid was sent to pharmacy.

## 2022-05-13 NOTE — Telephone Encounter (Signed)
-----   Message from Mylinda Latina, PA-C sent at 05/13/2022 11:28 AM EDT ----- Please let her know that her culture did come back with a small amount of growth again and ask if she is having symptoms. If so I will go ahead and send ABX and put in urology referral as previously discussed

## 2022-05-28 ENCOUNTER — Other Ambulatory Visit: Payer: Self-pay | Admitting: Physician Assistant

## 2022-05-28 DIAGNOSIS — G4700 Insomnia, unspecified: Secondary | ICD-10-CM

## 2022-05-28 MED ORDER — TRAZODONE HCL 50 MG PO TABS: ORAL_TABLET | ORAL | 1 refills | Status: DC

## 2022-06-03 ENCOUNTER — Other Ambulatory Visit: Payer: Self-pay

## 2022-06-03 DIAGNOSIS — N390 Urinary tract infection, site not specified: Secondary | ICD-10-CM

## 2022-06-03 MED ORDER — NITROFURANTOIN MONOHYD MACRO 100 MG PO CAPS: ORAL_CAPSULE | ORAL | 0 refills | Status: DC

## 2022-06-06 ENCOUNTER — Ambulatory Visit: Payer: BLUE CROSS/BLUE SHIELD | Admitting: Physician Assistant

## 2022-06-17 ENCOUNTER — Other Ambulatory Visit: Payer: Self-pay | Admitting: Physician Assistant

## 2022-06-17 DIAGNOSIS — G4700 Insomnia, unspecified: Secondary | ICD-10-CM

## 2022-06-17 MED ORDER — ZOLPIDEM TARTRATE 5 MG PO TABS: 5.0000 mg | ORAL_TABLET | Freq: Every evening | ORAL | 0 refills | Status: DC | PRN

## 2022-07-08 ENCOUNTER — Other Ambulatory Visit: Payer: Self-pay

## 2022-07-29 ENCOUNTER — Encounter: Payer: Self-pay | Admitting: Physician Assistant

## 2022-07-29 ENCOUNTER — Ambulatory Visit (INDEPENDENT_AMBULATORY_CARE_PROVIDER_SITE_OTHER): Payer: BLUE CROSS/BLUE SHIELD | Admitting: Physician Assistant

## 2022-07-29 VITALS — BP 132/86 | HR 86 | Temp 98.4°F | Resp 16 | Ht 63.0 in | Wt 137.6 lb

## 2022-07-29 DIAGNOSIS — M2011 Hallux valgus (acquired), right foot: Secondary | ICD-10-CM | POA: Diagnosis not present

## 2022-07-29 DIAGNOSIS — G4700 Insomnia, unspecified: Secondary | ICD-10-CM

## 2022-07-29 MED ORDER — ZOLPIDEM TARTRATE 5 MG PO TABS: 5.0000 mg | ORAL_TABLET | Freq: Every evening | ORAL | 2 refills | Status: DC | PRN

## 2022-07-29 NOTE — Progress Notes (Signed)
[facility] [address] [place], [place] [address]  Internal MEDICINE  Office Visit Note  Patient Name: Kimberly Romero  [id number]  [id number]  Date of Service: 08/03/2022  Chief Complaint  Patient presents with   Follow-up   Depression   Anxiety    HPI Pt is here for routine follow up -Urology has her taking macrobid daily and has a CT scan on Thurs--concern for kidney stones, she has a hx of these -Does have bunion on right foot, had surgery for same thing on left foot about 9 years ago. Rubbing along shoes and uncomfortable. Requests referral -Sleeping well on ambien now. Advised to try 1/2 tab and see if this still helps -Mood has been good and is doing well on wellbutrrin  Current Medication: Outpatient Encounter Medications as of 07/29/2022  Medication Sig   ALPRAZolam (XANAX) 0.25 MG tablet Half to one tab tab as needed for plane ride   buPROPion (WELLBUTRIN XL) 300 MG 24 hr tablet Take 1 tablet (300 mg total) by mouth daily.   fluticasone (FLONASE) 50 MCG/ACT nasal spray Place 2 sprays into both nostrils daily.   levonorgestrel (MIRENA) 20 MCG/24HR IUD 1 each by Intrauterine route once.   nitrofurantoin, macrocrystal-monohydrate, (MACROBID) 100 MG capsule Take 1 cap twice per day for 10 days.   [DISCONTINUED] HYDROcodone-acetaminophen (NORCO/VICODIN) 5-325 MG tablet Take 1 tablet by mouth every 6 (six) hours as needed.   [DISCONTINUED] traZODone (DESYREL) 50 MG tablet Take 1 tablet by mouth at bedtime for 1-2 weeks, then may increase to 2 tablets (100mg  total).   [DISCONTINUED] zolpidem (AMBIEN) 5 MG tablet Take 1 tablet (5 mg total) by mouth at bedtime as needed for sleep.   zolpidem (AMBIEN) 5 MG tablet Take 1 tablet (5 mg total) by mouth at bedtime as needed for sleep.   No facility-administered encounter medications on file as of 07/29/2022.    Surgical History: Past Surgical History:  Procedure Laterality Date   AUGMENTATION MAMMAPLASTY Bilateral  2013   BREAST SURGERY     CESAREAN SECTION     X 2   CESAREAN SECTION      Medical History: Past Medical History:  Diagnosis Date   Anxiety    Depression    Disturbed sleep rhythm    Enlarged thyroid    History of hypothyroidism    Kidney stones    Thyroid disease    Vaginal Pap smear, abnormal    years ago    Family History: Family History  Problem Relation Age of Onset   Cancer Mother    Cancer Maternal Grandfather        colon   Prostate cancer Neg Hx    Kidney cancer Neg Hx    Breast cancer Neg Hx    Ovarian cancer Neg Hx    Colon cancer Neg Hx    Mental illness Neg Hx     Social History   Socioeconomic History   Marital status: Married    Spouse name: Not on file   Number of children: Not on file   Years of education: Not on file   Highest education level: Not on file  Occupational History   Not on file  Tobacco Use   Smoking status: Never   Smokeless tobacco: Never  Vaping Use   Vaping Use: Never used  Substance and Sexual Activity   Alcohol use: Yes    Comment: Occasional    Drug use: Never   Sexual activity: Yes    Birth  control/protection: I.U.D.    Comment: Mirena  Other Topics Concern   Not on file  Social History Narrative   ** Merged History Encounter **       Social Determinants of Health   Financial Resource Strain: Not on file  Food Insecurity: Not on file  Transportation Needs: Not on file  Physical Activity: Not on file  Stress: Not on file  Social Connections: Not on file  Intimate Partner Violence: Not on file      Review of Systems  Constitutional:  Negative for chills, fatigue and unexpected weight change.  HENT:  Negative for congestion, postnasal drip, rhinorrhea, sneezing and sore throat.   Eyes:  Negative for redness.  Respiratory:  Negative for cough, chest tightness and shortness of breath.   Cardiovascular:  Negative for chest pain and palpitations.  Gastrointestinal:  Negative for abdominal pain,  constipation, diarrhea, nausea and vomiting.  Genitourinary:  Negative for dysuria and frequency.  Musculoskeletal:  Negative for arthralgias, back pain, joint swelling and neck pain.  Skin:  Negative for rash.  Neurological: Negative.  Negative for tremors and numbness.  Hematological:  Negative for adenopathy. Does not bruise/bleed easily.  Psychiatric/Behavioral:  Negative for behavioral problems (Depression), sleep disturbance and suicidal ideas. The patient is not nervous/anxious.     Vital Signs: BP 132/86 Comment: 140/91  Pulse 86   Temp 98.4 F (36.9 C)   Resp 16   Ht 5\' 3"  (1.6 m)   Wt 137 lb 9.6 oz (62.4 kg)   SpO2 99%   BMI 24.37 kg/m    Physical Exam Vitals and nursing note reviewed.  Constitutional:      General: She is not in acute distress.    Appearance: Normal appearance. She is well-developed and normal weight. She is not diaphoretic.  HENT:     Head: Normocephalic and atraumatic.     Mouth/Throat:     Pharynx: No oropharyngeal exudate.  Eyes:     Pupils: Pupils are equal, round, and reactive to light.  Neck:     Thyroid: No thyromegaly.     Vascular: No JVD.     Trachea: No tracheal deviation.  Cardiovascular:     Rate and Rhythm: Normal rate and regular rhythm.     Heart sounds: Normal heart sounds. No murmur heard.    No friction rub. No gallop.  Pulmonary:     Effort: Pulmonary effort is normal.  Abdominal:     General: Bowel sounds are normal.     Palpations: Abdomen is soft.     Tenderness: There is no abdominal tenderness.  Musculoskeletal:        General: Normal range of motion.     Cervical back: Normal range of motion and neck supple.  Lymphadenopathy:     Cervical: No cervical adenopathy.  Skin:    General: Skin is warm and dry.  Neurological:     Mental Status: She is alert and oriented to person, place, and time.     Cranial Nerves: No cranial nerve deficit.  Psychiatric:        Behavior: Behavior normal.        Thought  Content: Thought content normal.        Judgment: Judgment normal.        Assessment/Plan: 1. Insomnia, unspecified type May continuee ambien prn - zolpidem (AMBIEN) 5 MG tablet; Take 1 tablet (5 mg total) by mouth at bedtime as needed for sleep.  Dispense: 30 tablet; Refill: 2  2. Hallux  valgus (acquired), right foot Will refer to podiatry - Ambulatory referral to Podiatry   General Counseling: Taler verbalizes understanding of the findings of todays visit and agrees with plan of treatment. I have discussed any further diagnostic evaluation that may be needed or ordered today. We also reviewed her medications today. she has been encouraged to call the office with any questions or concerns that should arise related to todays visit.    Orders Placed This Encounter  Procedures   Ambulatory referral to Podiatry    Meds ordered this encounter  Medications   zolpidem (AMBIEN) 5 MG tablet    Sig: Take 1 tablet (5 mg total) by mouth at bedtime as needed for sleep.    Dispense:  30 tablet    Refill:  2    This patient was seen by Lynn Ito, PA-C in collaboration with Dr. Beverely Risen as a part of collaborative care agreement.   Total time spent:30 Minutes Time spent includes review of chart, medications, test results, and follow up plan with the patient.      Dr Lyndon Code Internal medicine

## 2022-07-30 ENCOUNTER — Telehealth: Payer: Self-pay | Admitting: Physician Assistant

## 2022-07-30 NOTE — Telephone Encounter (Signed)
Awaiting 07/29/22 office notes for Podiatry referral-Toni

## 2022-08-06 ENCOUNTER — Telehealth: Payer: Self-pay | Admitting: Physician Assistant

## 2022-08-06 NOTE — Telephone Encounter (Signed)
Podiatry referral faxed to [facility]; [phone]

## 2022-08-21 ENCOUNTER — Ambulatory Visit
Admission: RE | Admit: 2022-08-21 | Discharge: 2022-08-21 | Disposition: A | Discharge status: Home / Self Care | Payer: BLUE CROSS/BLUE SHIELD | Source: Ambulatory Visit | Attending: Physician Assistant | Admitting: Physician Assistant

## 2022-08-21 DIAGNOSIS — Z1231 Encounter for screening mammogram for malignant neoplasm of breast: Secondary | ICD-10-CM | POA: Diagnosis present

## 2022-09-30 ENCOUNTER — Other Ambulatory Visit: Payer: Self-pay | Admitting: Physician Assistant

## 2022-09-30 ENCOUNTER — Encounter: Payer: Self-pay | Admitting: Physician Assistant

## 2022-09-30 DIAGNOSIS — G4700 Insomnia, unspecified: Secondary | ICD-10-CM

## 2022-09-30 MED ORDER — MIRTAZAPINE 15 MG PO TABS: 15.0000 mg | ORAL_TABLET | Freq: Every day | ORAL | 2 refills | Status: DC

## 2022-10-10 ENCOUNTER — Other Ambulatory Visit: Payer: Self-pay | Admitting: Physician Assistant

## 2022-10-10 DIAGNOSIS — R4586 Emotional lability: Secondary | ICD-10-CM

## 2022-10-24 ENCOUNTER — Encounter: Payer: Self-pay | Admitting: Physician Assistant

## 2022-10-24 ENCOUNTER — Ambulatory Visit (INDEPENDENT_AMBULATORY_CARE_PROVIDER_SITE_OTHER): Payer: BLUE CROSS/BLUE SHIELD | Admitting: Physician Assistant

## 2022-10-24 VITALS — BP 115/70 | HR 78 | Temp 98.0°F | Resp 16 | Ht 63.0 in | Wt 141.2 lb

## 2022-10-24 DIAGNOSIS — R4586 Emotional lability: Secondary | ICD-10-CM

## 2022-10-24 DIAGNOSIS — R5383 Other fatigue: Secondary | ICD-10-CM

## 2022-10-24 DIAGNOSIS — R946 Abnormal results of thyroid function studies: Secondary | ICD-10-CM

## 2022-10-24 DIAGNOSIS — R7989 Other specified abnormal findings of blood chemistry: Secondary | ICD-10-CM | POA: Diagnosis not present

## 2022-10-24 DIAGNOSIS — E782 Mixed hyperlipidemia: Secondary | ICD-10-CM

## 2022-10-24 DIAGNOSIS — G4700 Insomnia, unspecified: Secondary | ICD-10-CM | POA: Diagnosis not present

## 2022-10-24 MED ORDER — MIRTAZAPINE 15 MG PO TABS: 15.0000 mg | ORAL_TABLET | Freq: Every day | ORAL | 1 refills | Status: DC

## 2022-10-24 NOTE — Progress Notes (Signed)
[facility] [place], [place] [address]  Internal MEDICINE  Office Visit Note  Patient Name: Kimberly Romero  [id number]  [id number]  Date of Service: 10/24/2022  Chief Complaint  Patient presents with   Follow-up   Medication Refill    mirtazapine    HPI Pt is here for routine follow up -She is doing well back on mirtazapine for her insomnia. Will go ahead and refill for 90day supply now -Doing well on wellbutrin as well, no problems with mood since being on this -Urology is following and has her on daily macrobid. Did have CT and she does have kidney stones. No repeat UTI since this. She has another follow up with them soon -Podiatry is going to be doing surgery to correct valgus deformity on right foot. Plan is to have this mid April and should be about 6 week recovery -Will go ahead and order fasting labs for CPE  Current Medication: Outpatient Encounter Medications as of 10/24/2022  Medication Sig   ALPRAZolam (XANAX) 0.25 MG tablet Half to one tab tab as needed for plane ride   buPROPion (WELLBUTRIN XL) 300 MG 24 hr tablet TAKE ONE TABLET BY MOUTH ONE TIME DAILY   fluticasone (FLONASE) 50 MCG/ACT nasal spray Place 2 sprays into both nostrils daily.   levonorgestrel (MIRENA) 20 MCG/24HR IUD 1 each by Intrauterine route once.   nitrofurantoin, macrocrystal-monohydrate, (MACROBID) 100 MG capsule Take 1 cap twice per day for 10 days.   [DISCONTINUED] mirtazapine (REMERON) 15 MG tablet Take 1 tablet (15 mg total) by mouth at bedtime.   mirtazapine (REMERON) 15 MG tablet Take 1 tablet (15 mg total) by mouth at bedtime.   No facility-administered encounter medications on file as of 10/24/2022.    Surgical History: Past Surgical History:  Procedure Laterality Date   AUGMENTATION MAMMAPLASTY Bilateral 2013   BREAST SURGERY     CESAREAN SECTION     X 2   CESAREAN SECTION      Medical History: Past Medical History:  Diagnosis Date   Anxiety     Depression    Disturbed sleep rhythm    Enlarged thyroid    History of hypothyroidism    Kidney stones    Thyroid disease    Vaginal Pap smear, abnormal    years ago    Family History: Family History  Problem Relation Age of Onset   Cancer Mother    Cancer Maternal Grandfather        colon   Prostate cancer Neg Hx    Kidney cancer Neg Hx    Breast cancer Neg Hx    Ovarian cancer Neg Hx    Colon cancer Neg Hx    Mental illness Neg Hx     Social History   Socioeconomic History   Marital status: Married    Spouse name: Not on file   Number of children: Not on file   Years of education: Not on file   Highest education level: Not on file  Occupational History   Not on file  Tobacco Use   Smoking status: Never   Smokeless tobacco: Never  Vaping Use   Vaping Use: Never used  Substance and Sexual Activity   Alcohol use: Yes    Comment: Occasional    Drug use: Never   Sexual activity: Yes    Birth control/protection: I.U.D.    Comment: Mirena  Other Topics Concern   Not on file  Social History Narrative   ** Merged  History Encounter **       Social Determinants of Health   Financial Resource Strain: Not on file  Food Insecurity: Not on file  Transportation Needs: Not on file  Physical Activity: Not on file  Stress: Not on file  Social Connections: Not on file  Intimate Partner Violence: Not on file      Review of Systems  Constitutional:  Negative for chills, fatigue and unexpected weight change.  HENT:  Negative for congestion, postnasal drip, rhinorrhea, sneezing and sore throat.   Eyes:  Negative for redness.  Respiratory:  Negative for cough, chest tightness and shortness of breath.   Cardiovascular:  Negative for chest pain and palpitations.  Gastrointestinal:  Negative for abdominal pain, constipation, diarrhea, nausea and vomiting.  Genitourinary:  Negative for dysuria and frequency.  Musculoskeletal:  Negative for arthralgias, back pain, joint  swelling and neck pain.  Skin:  Negative for rash.  Neurological: Negative.  Negative for tremors and numbness.  Hematological:  Negative for adenopathy. Does not bruise/bleed easily.  Psychiatric/Behavioral:  Positive for sleep disturbance. Negative for behavioral problems (Depression) and suicidal ideas. The patient is not nervous/anxious.     Vital Signs: BP 115/70   Pulse 78   Temp 98 F (36.7 C)   Resp 16   Ht '5\' 3"'$  (1.6 m)   Wt 141 lb 3.2 oz (64 kg)   SpO2 98%   BMI 25.01 kg/m    Physical Exam Vitals and nursing note reviewed.  Constitutional:      General: She is not in acute distress.    Appearance: Normal appearance. She is well-developed and normal weight. She is not diaphoretic.  HENT:     Head: Normocephalic and atraumatic.     Mouth/Throat:     Pharynx: No oropharyngeal exudate.  Eyes:     Pupils: Pupils are equal, round, and reactive to light.  Neck:     Thyroid: No thyromegaly.     Vascular: No JVD.     Trachea: No tracheal deviation.  Cardiovascular:     Rate and Rhythm: Normal rate and regular rhythm.     Heart sounds: Normal heart sounds. No murmur heard.    No friction rub. No gallop.  Pulmonary:     Effort: Pulmonary effort is normal.  Abdominal:     General: Bowel sounds are normal.     Palpations: Abdomen is soft.  Musculoskeletal:        General: Normal range of motion.     Cervical back: Normal range of motion and neck supple.  Lymphadenopathy:     Cervical: No cervical adenopathy.  Skin:    General: Skin is warm and dry.  Neurological:     Mental Status: She is alert and oriented to person, place, and time.     Cranial Nerves: No cranial nerve deficit.  Psychiatric:        Behavior: Behavior normal.        Thought Content: Thought content normal.        Judgment: Judgment normal.        Assessment/Plan: 1. Insomnia, unspecified type Stable, continue mirtazapine as before - mirtazapine (REMERON) 15 MG tablet; Take 1 tablet (15  mg total) by mouth at bedtime.  Dispense: 90 tablet; Refill: 1  2. Mood swings Stable, continue wellbutrin as before  3. Elevated serum creatinine - Comprehensive metabolic panel  4. Abnormal thyroid exam - TSH + free T4  5. Mixed hyperlipidemia - Lipid Panel With LDL/HDL Ratio  6. Other fatigue - CBC w/Diff/Platelet - Comprehensive metabolic panel - TSH + free T4 - Lipid Panel With LDL/HDL Ratio   General Counseling: Kierstynn verbalizes understanding of the findings of todays visit and agrees with plan of treatment. I have discussed any further diagnostic evaluation that may be needed or ordered today. We also reviewed her medications today. she has been encouraged to call the office with any questions or concerns that should arise related to todays visit.    Orders Placed This Encounter  Procedures   CBC w/Diff/Platelet   Comprehensive metabolic panel   TSH + free T4   Lipid Panel With LDL/HDL Ratio    Meds ordered this encounter  Medications   mirtazapine (REMERON) 15 MG tablet    Sig: Take 1 tablet (15 mg total) by mouth at bedtime.    Dispense:  90 tablet    Refill:  1    This patient was seen by Drema Dallas, PA-C in collaboration with Dr. Clayborn Bigness as a part of collaborative care agreement.   Total time spent:30 Minutes Time spent includes review of chart, medications, test results, and follow up plan with the patient.      Dr Lavera Guise Internal medicine

## 2023-02-06 ENCOUNTER — Encounter: Payer: BLUE CROSS/BLUE SHIELD | Admitting: Physician Assistant

## 2023-03-13 ENCOUNTER — Encounter: Payer: BLUE CROSS/BLUE SHIELD | Admitting: Physician Assistant

## 2023-04-09 ENCOUNTER — Other Ambulatory Visit: Payer: Self-pay | Admitting: Physician Assistant

## 2023-04-09 DIAGNOSIS — R4586 Emotional lability: Secondary | ICD-10-CM

## 2023-04-10 ENCOUNTER — Encounter: Payer: BLUE CROSS/BLUE SHIELD | Admitting: Physician Assistant

## 2023-04-28 ENCOUNTER — Encounter: Payer: Self-pay | Admitting: Physician Assistant

## 2023-04-28 ENCOUNTER — Ambulatory Visit (INDEPENDENT_AMBULATORY_CARE_PROVIDER_SITE_OTHER): Payer: BLUE CROSS/BLUE SHIELD | Admitting: Physician Assistant

## 2023-04-28 VITALS — BP 120/88 | HR 82 | Temp 98.2°F | Resp 16 | Ht 63.0 in | Wt 140.4 lb

## 2023-04-28 DIAGNOSIS — R946 Abnormal results of thyroid function studies: Secondary | ICD-10-CM

## 2023-04-28 DIAGNOSIS — Z1231 Encounter for screening mammogram for malignant neoplasm of breast: Secondary | ICD-10-CM

## 2023-04-28 DIAGNOSIS — R5383 Other fatigue: Secondary | ICD-10-CM

## 2023-04-28 DIAGNOSIS — Z0001 Encounter for general adult medical examination with abnormal findings: Secondary | ICD-10-CM

## 2023-04-28 DIAGNOSIS — R4586 Emotional lability: Secondary | ICD-10-CM

## 2023-04-28 DIAGNOSIS — E782 Mixed hyperlipidemia: Secondary | ICD-10-CM | POA: Diagnosis not present

## 2023-04-28 DIAGNOSIS — R300 Dysuria: Secondary | ICD-10-CM

## 2023-04-28 NOTE — Progress Notes (Signed)
[facility] [address] [place], [place] [address]  Internal MEDICINE  Office Visit Note  Patient Name: Kimberly Romero  [id number]  [id number]  Date of Service: 04/28/2023  Chief Complaint  Patient presents with   Annual Exam   Depression     HPI Pt is here for routine health maintenance examination and is doing well -Followed by urology and is on macrobid long term for recurrent UTI -doing well with wellbutrin -Stopped mirtazapine, sleeping well. Takes one occasionally if needed, but overall doing well -still trying to work on weight loss. Heavier than she is used to despite being in healthy weight range. Comtinues to work on this. Does spin class for 4 days per week, occasional light weights -will check labs -due for pap in Dec, not yet due for IUD replacement and is not seeing GYN at this time.  Current Medication: Outpatient Encounter Medications as of 04/28/2023  Medication Sig   buPROPion (WELLBUTRIN XL) 300 MG 24 hr tablet TAKE ONE TABLET BY MOUTH ONE TIME DAILY   levonorgestrel (MIRENA) 20 MCG/24HR IUD 1 each by Intrauterine route once.   nitrofurantoin, macrocrystal-monohydrate, (MACROBID) 100 MG capsule Take 1 cap twice per day for 10 days.   [DISCONTINUED] ALPRAZolam (XANAX) 0.25 MG tablet Half to one tab tab as needed for plane ride   [DISCONTINUED] fluticasone (FLONASE) 50 MCG/ACT nasal spray Place 2 sprays into both nostrils daily.   [DISCONTINUED] mirtazapine (REMERON) 15 MG tablet Take 1 tablet (15 mg total) by mouth at bedtime.   No facility-administered encounter medications on file as of 04/28/2023.    Surgical History: Past Surgical History:  Procedure Laterality Date   AUGMENTATION MAMMAPLASTY Bilateral 2013   BREAST SURGERY     CESAREAN SECTION     X 2   CESAREAN SECTION      Medical History: Past Medical History:  Diagnosis Date   Anxiety    Depression    Disturbed sleep rhythm    Enlarged thyroid    History of  hypothyroidism    Kidney stones    Thyroid disease    Vaginal Pap smear, abnormal    years ago    Family History: Family History  Problem Relation Age of Onset   Cancer Mother    Cancer Maternal Grandfather        colon   Prostate cancer Neg Hx    Kidney cancer Neg Hx    Breast cancer Neg Hx    Ovarian cancer Neg Hx    Colon cancer Neg Hx    Mental illness Neg Hx       Review of Systems  Constitutional:  Negative for chills, fatigue and unexpected weight change.  HENT:  Negative for congestion, postnasal drip, rhinorrhea, sneezing and sore throat.   Eyes:  Negative for redness.  Respiratory:  Negative for cough, chest tightness and shortness of breath.   Cardiovascular:  Negative for chest pain and palpitations.  Gastrointestinal:  Negative for abdominal pain, constipation, diarrhea, nausea and vomiting.  Genitourinary:  Negative for dysuria and frequency.  Musculoskeletal:  Negative for arthralgias, back pain, joint swelling and neck pain.  Skin:  Negative for rash.  Neurological: Negative.  Negative for tremors and numbness.  Hematological:  Negative for adenopathy. Does not bruise/bleed easily.  Psychiatric/Behavioral:  Negative for behavioral problems (Depression) and suicidal ideas. The patient is not nervous/anxious.      Vital Signs: BP 120/88 Comment: 120/90  Pulse 82   Temp 98.2 F (36.8 C)  Resp 16   Ht 5\' 3"  (1.6 m)   Wt 140 lb 6.4 oz (63.7 kg)   SpO2 99%   BMI 24.87 kg/m    Physical Exam Vitals and nursing note reviewed.  Constitutional:      General: She is not in acute distress.    Appearance: Normal appearance. She is well-developed and normal weight. She is not diaphoretic.  HENT:     Head: Normocephalic and atraumatic.     Mouth/Throat:     Pharynx: No oropharyngeal exudate.  Eyes:     Pupils: Pupils are equal, round, and reactive to light.  Neck:     Thyroid: No thyromegaly.     Vascular: No JVD.     Trachea: No tracheal deviation.   Cardiovascular:     Rate and Rhythm: Normal rate and regular rhythm.     Heart sounds: Normal heart sounds. No murmur heard.    No friction rub. No gallop.  Pulmonary:     Effort: Pulmonary effort is normal.  Abdominal:     General: Bowel sounds are normal.     Palpations: Abdomen is soft.     Tenderness: There is no abdominal tenderness.  Musculoskeletal:        General: Normal range of motion.     Cervical back: Normal range of motion and neck supple.  Lymphadenopathy:     Cervical: No cervical adenopathy.  Skin:    General: Skin is warm and dry.  Neurological:     Mental Status: She is alert and oriented to person, place, and time.     Cranial Nerves: No cranial nerve deficit.  Psychiatric:        Behavior: Behavior normal.        Thought Content: Thought content normal.        Judgment: Judgment normal.      LABS: No results found for this or any previous visit (from the past 2160 hour(s)).      Assessment/Plan: 1. Encounter for general adult medical examination with abnormal findings CPE performed, labs ordered, mammogram due in Jan  2. Mood swings Stable, continue wellbutrin as before  3. Mixed hyperlipidemia - Lipid Panel With LDL/HDL Ratio  4. Abnormal thyroid exam - TSH + free T4  5. Other fatigue - CBC w/Diff/Platelet - Comprehensive metabolic panel - TSH + free T4 - Lipid Panel With LDL/HDL Ratio  6. Visit for screening mammogram - MM 3D SCREENING MAMMOGRAM BILATERAL BREAST W/IMPLANT; Future  7. Dysuria - UA/M w/rflx Culture, Routine   General Counseling: Maikayla verbalizes understanding of the findings of todays visit and agrees with plan of treatment. I have discussed any further diagnostic evaluation that may be needed or ordered today. We also reviewed her medications today. she has been encouraged to call the office with any questions or concerns that should arise related to todays visit.    Counseling:    Orders Placed This  Encounter  Procedures   MM 3D SCREENING MAMMOGRAM BILATERAL BREAST W/IMPLANT   UA/M w/rflx Culture, Routine   CBC w/Diff/Platelet   Comprehensive metabolic panel   TSH + free T4   Lipid Panel With LDL/HDL Ratio    No orders of the defined types were placed in this encounter.   This patient was seen by Lynn Ito, PA-C in collaboration with Dr. Beverely Risen as a part of collaborative care agreement.  Total time spent:35 Minutes  Time spent includes review of chart, medications, test results, and follow up plan with the  patient.     Lyndon Code, MD  Internal Medicine

## 2023-04-29 LAB — UA/M W/RFLX CULTURE, ROUTINE
Bilirubin, UA: NEGATIVE
Glucose, UA: NEGATIVE
Ketones, UA: NEGATIVE
Leukocytes,UA: NEGATIVE
Nitrite, UA: NEGATIVE
Protein,UA: NEGATIVE
RBC, UA: NEGATIVE
Specific Gravity, UA: 1.015 (ref 1.005–1.030)
Urobilinogen, Ur: 0.2 mg/dL (ref 0.2–1.0)
pH, UA: 7 (ref 5.0–7.5)

## 2023-04-29 LAB — MICROSCOPIC EXAMINATION: Casts: NONE SEEN /LPF

## 2023-05-02 ENCOUNTER — Telehealth: Payer: Self-pay

## 2023-05-02 NOTE — Telephone Encounter (Signed)
Spoke with patient regarding urine culture results.

## 2023-05-02 NOTE — Telephone Encounter (Signed)
-----   Message from Carlean Jews sent at 05/02/2023 10:52 AM EDT ----- Can let her know that her urine looked ok except for some blood seen and may want to let urology know

## 2023-07-18 ENCOUNTER — Other Ambulatory Visit: Payer: Self-pay | Admitting: Physician Assistant

## 2023-07-18 DIAGNOSIS — R4586 Emotional lability: Secondary | ICD-10-CM

## 2023-08-25 ENCOUNTER — Ambulatory Visit
Admission: RE | Admit: 2023-08-25 | Discharge: 2023-08-25 | Disposition: A | Discharge status: Home / Self Care | Payer: BLUE CROSS/BLUE SHIELD | Source: Ambulatory Visit | Attending: Physician Assistant | Admitting: Physician Assistant

## 2023-08-25 DIAGNOSIS — Z1231 Encounter for screening mammogram for malignant neoplasm of breast: Secondary | ICD-10-CM | POA: Diagnosis present

## 2023-10-27 ENCOUNTER — Encounter: Payer: Self-pay | Admitting: Physician Assistant

## 2023-10-27 ENCOUNTER — Ambulatory Visit (INDEPENDENT_AMBULATORY_CARE_PROVIDER_SITE_OTHER): Payer: BLUE CROSS/BLUE SHIELD | Admitting: Physician Assistant

## 2023-10-27 VITALS — BP 110/70 | HR 75 | Temp 98.5°F | Resp 16 | Ht 63.0 in | Wt 135.4 lb

## 2023-10-27 DIAGNOSIS — R4586 Emotional lability: Secondary | ICD-10-CM | POA: Diagnosis not present

## 2023-10-27 DIAGNOSIS — Z7689 Persons encountering health services in other specified circumstances: Secondary | ICD-10-CM | POA: Diagnosis not present

## 2023-10-27 DIAGNOSIS — E782 Mixed hyperlipidemia: Secondary | ICD-10-CM

## 2023-10-27 DIAGNOSIS — R946 Abnormal results of thyroid function studies: Secondary | ICD-10-CM

## 2023-10-27 DIAGNOSIS — R5383 Other fatigue: Secondary | ICD-10-CM

## 2023-10-27 MED ORDER — BUPROPION HCL ER (XL) 300 MG PO TB24: 300.0000 mg | ORAL_TABLET | Freq: Every day | ORAL | 1 refills | Status: DC

## 2023-10-27 NOTE — Progress Notes (Signed)
 [facility] [address] [place], [place] [address]  Internal MEDICINE  Office Visit Note  Patient Name: Kimberly Romero  [id number]  [id number]  Date of Service: 11/08/2023   Chief Complaint  Patient presents with   Follow-up   Depression     HPI Pt is here for routine follow up -Prefers to wait for pap until CPE, advised it needs to be done this year -will reorder labs -Doing well on wellbutrin -down 5lbs since last visit -Following with urology, on macrobid daily. They are monitoring stones as well -skin reaction when going in the sun, has tried different types of sunscreens, but same reaction regardless.  Gets white spots that put off heat. Started on shoulder in the last year but now on chest and forehead as well  Current Medication: Outpatient Encounter Medications as of 10/27/2023  Medication Sig   levonorgestrel (MIRENA) 20 MCG/24HR IUD 1 each by Intrauterine route once.   nitrofurantoin, macrocrystal-monohydrate, (MACROBID) 100 MG capsule Take 1 cap twice per day for 10 days.   [DISCONTINUED] buPROPion (WELLBUTRIN XL) 300 MG 24 hr tablet TAKE ONE TABLET BY MOUTH ONE TIME DAILY   buPROPion (WELLBUTRIN XL) 300 MG 24 hr tablet Take 1 tablet (300 mg total) by mouth daily.   No facility-administered encounter medications on file as of 10/27/2023.    Surgical History: Past Surgical History:  Procedure Laterality Date   AUGMENTATION MAMMAPLASTY Bilateral 2013   BREAST SURGERY     CESAREAN SECTION     X 2   CESAREAN SECTION      Medical History: Past Medical History:  Diagnosis Date   Anxiety    Depression    Disturbed sleep rhythm    Enlarged thyroid    History of hypothyroidism    Kidney stones    Thyroid disease    Vaginal Pap smear, abnormal    years ago    Family History: Family History  Problem Relation Age of Onset   Cancer Mother    Cancer Maternal Grandfather        colon   Prostate cancer Neg Hx    Kidney cancer Neg Hx     Breast cancer Neg Hx    Ovarian cancer Neg Hx    Colon cancer Neg Hx    Mental illness Neg Hx     Social History   Socioeconomic History   Marital status: Married    Spouse name: Not on file   Number of children: Not on file   Years of education: Not on file   Highest education level: Not on file  Occupational History   Not on file  Tobacco Use   Smoking status: Never   Smokeless tobacco: Never  Vaping Use   Vaping status: Never Used  Substance and Sexual Activity   Alcohol use: Yes    Comment: Occasional    Drug use: Never   Sexual activity: Yes    Birth control/protection: I.U.D.    Comment: Mirena  Other Topics Concern   Not on file  Social History Narrative   ** Merged History Encounter **       Social Drivers of Corporate investment banker Strain: Not on file  Food Insecurity: Not on file  Transportation Needs: Not on file  Physical Activity: Not on file  Stress: Not on file  Social Connections: Not on file  Intimate Partner Violence: Not on file     Review of Systems  Constitutional:  Negative for chills, fatigue and  unexpected weight change.  HENT:  Negative for congestion, postnasal drip, rhinorrhea, sneezing and sore throat.   Eyes:  Negative for redness.  Respiratory:  Negative for cough, chest tightness and shortness of breath.   Cardiovascular:  Negative for chest pain and palpitations.  Gastrointestinal:  Negative for abdominal pain, constipation, diarrhea, nausea and vomiting.  Genitourinary:  Negative for dysuria and frequency.  Musculoskeletal:  Negative for arthralgias, back pain, joint swelling and neck pain.  Skin:  Positive for rash.  Neurological: Negative.  Negative for tremors and numbness.  Hematological:  Negative for adenopathy. Does not bruise/bleed easily.  Psychiatric/Behavioral:  Negative for behavioral problems (Depression) and suicidal ideas. The patient is not nervous/anxious.      Vital signs: BP 110/70   Pulse 75    Temp 98.5 F (36.9 C)   Resp 16   Ht 5\' 3"  (1.6 m)   Wt 135 lb 6.4 oz (61.4 kg)   SpO2 99%   BMI 23.99 kg/m    Physical Exam Vitals and nursing note reviewed.  Constitutional:      General: She is not in acute distress.    Appearance: Normal appearance. She is well-developed and normal weight. She is not diaphoretic.  HENT:     Head: Normocephalic and atraumatic.  Neck:     Thyroid: No thyromegaly.     Vascular: No JVD.     Trachea: No tracheal deviation.  Cardiovascular:     Rate and Rhythm: Normal rate and regular rhythm.     Heart sounds: Normal heart sounds. No murmur heard.    No friction rub. No gallop.  Pulmonary:     Effort: Pulmonary effort is normal.  Musculoskeletal:        General: Normal range of motion.     Cervical back: Normal range of motion and neck supple.  Lymphadenopathy:     Cervical: No cervical adenopathy.  Skin:    General: Skin is warm and dry.  Neurological:     Mental Status: She is alert and oriented to person, place, and time.  Psychiatric:        Behavior: Behavior normal.        Thought Content: Thought content normal.        Judgment: Judgment normal.       Assessment/Plan: 1. Encounter for skin care (Primary) Abnormal skin reaction to sunlight, will refer to dermatology - Ambulatory referral to Dermatology  2. Mood swings Stable, continue wellbutrin - buPROPion (WELLBUTRIN XL) 300 MG 24 hr tablet; Take 1 tablet (300 mg total) by mouth daily.  Dispense: 90 tablet; Refill: 1  3. Abnormal thyroid exam - TSH + free T4  4. Mixed hyperlipidemia - Lipid Panel With LDL/HDL Ratio  5. Other fatigue - Lipid Panel With LDL/HDL Ratio - CBC w/Diff/Platelet - TSH + free T4 - Comprehensive metabolic panel   General Counseling: Nadra verbalizes understanding of the findings of todays visit and agrees with plan of treatment. I have discussed any further diagnostic evaluation that may be needed or ordered today. We also reviewed her  medications today. she has been encouraged to call the office with any questions or concerns that should arise related to todays visit.    Counseling:    Orders Placed This Encounter  Procedures   Lipid Panel With LDL/HDL Ratio   CBC w/Diff/Platelet   TSH + free T4   Comprehensive metabolic panel   Ambulatory referral to Dermatology    Meds ordered this encounter  Medications  buPROPion (WELLBUTRIN XL) 300 MG 24 hr tablet    Sig: Take 1 tablet (300 mg total) by mouth daily.    Dispense:  90 tablet    Refill:  1    Time spent:30 Minutes

## 2023-10-28 ENCOUNTER — Telehealth: Payer: Self-pay | Admitting: Physician Assistant

## 2023-10-28 NOTE — Telephone Encounter (Signed)
 Awaiting 10/27/23 office notes for Dermatology referral-Toni

## 2023-11-12 ENCOUNTER — Telehealth: Payer: Self-pay | Admitting: Physician Assistant

## 2023-11-12 NOTE — Telephone Encounter (Signed)
 Dermatology referral faxed to [facility]/[place]; [phone]. Lvm notifying  patient. Gave pt telephone [phone]

## 2024-04-29 LAB — CBC WITH DIFFERENTIAL/PLATELET
Basophils Absolute: 0.1 x10E3/uL (ref 0.0–0.2)
Basos: 1 %
EOS (ABSOLUTE): 0.1 x10E3/uL (ref 0.0–0.4)
Eos: 2 %
Hematocrit: 43.8 % (ref 34.0–46.6)
Hemoglobin: 14.8 g/dL (ref 11.1–15.9)
Immature Grans (Abs): 0 x10E3/uL (ref 0.0–0.1)
Immature Granulocytes: 0 %
Lymphocytes Absolute: 1.7 x10E3/uL (ref 0.7–3.1)
Lymphs: 35 %
MCH: 32.5 pg (ref 26.6–33.0)
MCHC: 33.8 g/dL (ref 31.5–35.7)
MCV: 96 fL (ref 79–97)
Monocytes Absolute: 0.5 x10E3/uL (ref 0.1–0.9)
Monocytes: 9 %
Neutrophils Absolute: 2.6 x10E3/uL (ref 1.4–7.0)
Neutrophils: 53 %
Platelets: 333 x10E3/uL (ref 150–450)
RBC: 4.56 x10E6/uL (ref 3.77–5.28)
RDW: 12.7 % (ref 11.7–15.4)
WBC: 4.8 x10E3/uL (ref 3.4–10.8)

## 2024-04-29 LAB — TSH+FREE T4
Free T4: 1.64 ng/dL (ref 0.82–1.77)
TSH: 1.73 u[IU]/mL (ref 0.450–4.500)

## 2024-04-29 LAB — COMPREHENSIVE METABOLIC PANEL WITH GFR
ALT: 16 IU/L (ref 0–32)
AST: 23 IU/L (ref 0–40)
Albumin: 4.6 g/dL (ref 3.9–4.9)
Alkaline Phosphatase: 63 IU/L (ref 44–121)
BUN/Creatinine Ratio: 12 (ref 9–23)
BUN: 13 mg/dL (ref 6–24)
Bilirubin Total: 0.3 mg/dL (ref 0.0–1.2)
CO2: 23 mmol/L (ref 20–29)
Calcium: 9.6 mg/dL (ref 8.7–10.2)
Chloride: 101 mmol/L (ref 96–106)
Creatinine, Ser: 1.12 mg/dL — ABNORMAL HIGH (ref 0.57–1.00)
Globulin, Total: 2.7 g/dL (ref 1.5–4.5)
Glucose: 100 mg/dL — ABNORMAL HIGH (ref 70–99)
Potassium: 3.8 mmol/L (ref 3.5–5.2)
Sodium: 140 mmol/L (ref 134–144)
Total Protein: 7.3 g/dL (ref 6.0–8.5)
eGFR: 62 mL/min/1.73 (ref >59)

## 2024-04-29 LAB — LIPID PANEL WITH LDL/HDL RATIO
Cholesterol, Total: 210 mg/dL — ABNORMAL HIGH (ref 100–199)
HDL: 99 mg/dL (ref >39)
LDL Chol Calc (NIH): 96 mg/dL (ref 0–99)
LDL/HDL Ratio: 1 ratio (ref 0.0–3.2)
Triglycerides: 86 mg/dL (ref 0–149)
VLDL Cholesterol Cal: 15 mg/dL (ref 5–40)

## 2024-05-03 ENCOUNTER — Encounter: Payer: Self-pay | Admitting: Physician Assistant

## 2024-05-03 ENCOUNTER — Ambulatory Visit (INDEPENDENT_AMBULATORY_CARE_PROVIDER_SITE_OTHER): Payer: BLUE CROSS/BLUE SHIELD | Admitting: Physician Assistant

## 2024-05-03 VITALS — BP 116/70 | HR 89 | Temp 97.6°F | Resp 16 | Ht 63.0 in | Wt 144.0 lb

## 2024-05-03 DIAGNOSIS — R7301 Impaired fasting glucose: Secondary | ICD-10-CM

## 2024-05-03 DIAGNOSIS — F5105 Insomnia due to other mental disorder: Secondary | ICD-10-CM | POA: Diagnosis not present

## 2024-05-03 DIAGNOSIS — E782 Mixed hyperlipidemia: Secondary | ICD-10-CM

## 2024-05-03 DIAGNOSIS — R4586 Emotional lability: Secondary | ICD-10-CM

## 2024-05-03 DIAGNOSIS — Z0001 Encounter for general adult medical examination with abnormal findings: Secondary | ICD-10-CM | POA: Diagnosis not present

## 2024-05-03 DIAGNOSIS — Z1231 Encounter for screening mammogram for malignant neoplasm of breast: Secondary | ICD-10-CM

## 2024-05-03 DIAGNOSIS — Z124 Encounter for screening for malignant neoplasm of cervix: Secondary | ICD-10-CM

## 2024-05-03 DIAGNOSIS — Z5320 Procedure and treatment not carried out because of patient's decision for unspecified reasons: Secondary | ICD-10-CM

## 2024-05-03 LAB — POCT GLYCOSYLATED HEMOGLOBIN (HGB A1C): Hemoglobin A1C: 5 % (ref 4.0–5.6)

## 2024-05-03 MED ORDER — MIRTAZAPINE 15 MG PO TABS: 15.0000 mg | ORAL_TABLET | Freq: Every day | ORAL | 2 refills | Status: AC

## 2024-05-03 MED ORDER — BUPROPION HCL ER (XL) 300 MG PO TB24: 300.0000 mg | ORAL_TABLET | Freq: Every day | ORAL | 1 refills | Status: AC

## 2024-05-03 NOTE — Progress Notes (Signed)
 [facility] [address] [place], [place] [address]  Internal MEDICINE  Office Visit Note  Patient Name: Kimberly Romero  [id number]  [id number]  Date of Service: 05/05/2024  Chief Complaint  Patient presents with   Annual Exam     HPI Pt is here for routine health maintenance examination -doing well, no concerns today -will be going to [place] soon and then also to [place] on a cruise in a few months -pap due today -declines colonoscopy or cologuard at this time -labs reviewed: BG borderline and will check A1c, cholesterol up some as well and will work on diet and exercise. Creatinine slightly elevated still and is overall stable. Does follow with urology for recurrent UTI and kidney stones  Current Medication: Outpatient Encounter Medications as of 05/03/2024  Medication Sig   levonorgestrel  (MIRENA ) 20 MCG/24HR IUD 1 each by Intrauterine route once.   mirtazapine  (REMERON ) 15 MG tablet Take 1 tablet (15 mg total) by mouth at bedtime.   nitrofurantoin , macrocrystal-monohydrate, (MACROBID ) 100 MG capsule Take 1 cap twice per day for 10 days.   [DISCONTINUED] buPROPion  (WELLBUTRIN  XL) 300 MG 24 hr tablet Take 1 tablet (300 mg total) by mouth daily.   buPROPion  (WELLBUTRIN  XL) 300 MG 24 hr tablet Take 1 tablet (300 mg total) by mouth daily.   No facility-administered encounter medications on file as of 05/03/2024.    Surgical History: Past Surgical History:  Procedure Laterality Date   AUGMENTATION MAMMAPLASTY Bilateral 2013   BREAST SURGERY     CESAREAN SECTION     X 2   CESAREAN SECTION      Medical History: Past Medical History:  Diagnosis Date   Anxiety    Depression    Disturbed sleep rhythm    Enlarged thyroid     History of hypothyroidism    Kidney stones    Thyroid  disease    Vaginal Pap smear, abnormal    years ago    Family History: Family History  Problem Relation Age of Onset   Cancer Mother    Cancer Maternal Grandfather         colon   Prostate cancer Neg Hx    Kidney cancer Neg Hx    Breast cancer Neg Hx    Ovarian cancer Neg Hx    Colon cancer Neg Hx    Mental illness Neg Hx       Review of Systems  Constitutional:  Negative for chills, fatigue and unexpected weight change.  HENT:  Negative for congestion, postnasal drip, rhinorrhea, sneezing and sore throat.   Eyes:  Negative for redness.  Respiratory:  Negative for cough, chest tightness and shortness of breath.   Cardiovascular:  Negative for chest pain and palpitations.  Gastrointestinal:  Negative for abdominal pain, constipation, diarrhea, nausea and vomiting.  Genitourinary:  Negative for dysuria and frequency.  Musculoskeletal:  Negative for arthralgias, back pain, joint swelling and neck pain.  Skin:  Negative for rash.  Neurological: Negative.  Negative for tremors and numbness.  Hematological:  Negative for adenopathy. Does not bruise/bleed easily.  Psychiatric/Behavioral:  Negative for behavioral problems (Depression) and suicidal ideas. The patient is not nervous/anxious.      Vital Signs: BP 116/70   Pulse 89   Temp 97.6 F (36.4 C)   Resp 16   Ht 5' 3 (1.6 m)   Wt 144 lb (65.3 kg)   SpO2 98%   BMI 25.51 kg/m    Physical Exam Vitals and nursing note reviewed. Exam conducted  with a chaperone present.  Constitutional:      General: She is not in acute distress.    Appearance: Normal appearance. She is well-developed and normal weight. She is not diaphoretic.  HENT:     Head: Normocephalic and atraumatic.     Mouth/Throat:     Pharynx: No oropharyngeal exudate.  Eyes:     Pupils: Pupils are equal, round, and reactive to light.  Neck:     Thyroid : No thyromegaly.     Vascular: No JVD.     Trachea: No tracheal deviation.  Cardiovascular:     Rate and Rhythm: Normal rate and regular rhythm.     Heart sounds: Normal heart sounds. No murmur heard.    No friction rub. No gallop.  Pulmonary:     Effort: Pulmonary effort is  normal.  Chest:  Breasts:    Right: Normal. No mass.     Left: Normal. No mass.  Abdominal:     General: Bowel sounds are normal.     Palpations: Abdomen is soft.     Tenderness: There is no abdominal tenderness.  Genitourinary:    Exam position: Lithotomy position.     Comments: Pap performed, IUD strings visible Musculoskeletal:        General: Normal range of motion.     Cervical back: Normal range of motion and neck supple.  Lymphadenopathy:     Cervical: No cervical adenopathy.  Skin:    General: Skin is warm and dry.  Neurological:     Mental Status: She is alert and oriented to person, place, and time.     Cranial Nerves: No cranial nerve deficit.  Psychiatric:        Behavior: Behavior normal.        Thought Content: Thought content normal.        Judgment: Judgment normal.      LABS: Recent Results (from the past 2160 hours)  Lipid Panel With LDL/HDL Ratio     Status: Abnormal   Collection Time: 04/28/24  9:28 AM  Result Value Ref Range   Cholesterol, Total 210 (H) 100 - 199 mg/dL   Triglycerides 86 0 - 149 mg/dL   HDL 99 >60 mg/dL   VLDL Cholesterol Cal 15 5 - 40 mg/dL   LDL Chol Calc (NIH) 96 0 - 99 mg/dL   LDL/HDL Ratio 1.0 0.0 - 3.2 ratio    Comment:                                     LDL/HDL Ratio                                             Men  Women                               1/2 Avg.Risk  1.0    1.5                                   Avg.Risk  3.6    3.2  2X Avg.Risk  6.2    5.0                                3X Avg.Risk  8.0    6.1   CBC w/Diff/Platelet     Status: None   Collection Time: 04/28/24  9:28 AM  Result Value Ref Range   WBC 4.8 3.4 - 10.8 x10E3/uL   RBC 4.56 3.77 - 5.28 x10E6/uL   Hemoglobin 14.8 11.1 - 15.9 g/dL   Hematocrit 56.1 65.9 - 46.6 %   MCV 96 79 - 97 fL   MCH 32.5 26.6 - 33.0 pg   MCHC 33.8 31.5 - 35.7 g/dL   RDW 87.2 88.2 - 84.5 %   Platelets 333 150 - 450 x10E3/uL   Neutrophils 53  Not Estab. %   Lymphs 35 Not Estab. %   Monocytes 9 Not Estab. %   Eos 2 Not Estab. %   Basos 1 Not Estab. %   Neutrophils Absolute 2.6 1.4 - 7.0 x10E3/uL   Lymphocytes Absolute 1.7 0.7 - 3.1 x10E3/uL   Monocytes Absolute 0.5 0.1 - 0.9 x10E3/uL   EOS (ABSOLUTE) 0.1 0.0 - 0.4 x10E3/uL   Basophils Absolute 0.1 0.0 - 0.2 x10E3/uL   Immature Granulocytes 0 Not Estab. %   Immature Grans (Abs) 0.0 0.0 - 0.1 x10E3/uL  TSH + free T4     Status: None   Collection Time: 04/28/24  9:28 AM  Result Value Ref Range   TSH 1.730 0.450 - 4.500 uIU/mL   Free T4 1.64 0.82 - 1.77 ng/dL  Comprehensive metabolic panel     Status: Abnormal   Collection Time: 04/28/24  9:28 AM  Result Value Ref Range   Glucose 100 (H) 70 - 99 mg/dL   BUN 13 6 - 24 mg/dL   Creatinine, Ser 8.87 (H) 0.57 - 1.00 mg/dL   eGFR 62 >40 fO/fpw/8.26   BUN/Creatinine Ratio 12 9 - 23   Sodium 140 134 - 144 mmol/L   Potassium 3.8 3.5 - 5.2 mmol/L   Chloride 101 96 - 106 mmol/L   CO2 23 20 - 29 mmol/L   Calcium 9.6 8.7 - 10.2 mg/dL   Total Protein 7.3 6.0 - 8.5 g/dL   Albumin 4.6 3.9 - 4.9 g/dL   Globulin, Total 2.7 1.5 - 4.5 g/dL   Bilirubin Total 0.3 0.0 - 1.2 mg/dL   Alkaline Phosphatase 63 44 - 121 IU/L    Comment: **Effective May 03, 2024 Alkaline Phosphatase**   reference interval will be changing to:              Age                Female          Female           0 -  5 days         47 - 127       47 - 127           6 - 10 days         29 - 242       29 - 242          11 - 20 days        109 - 357      109 - 357          21 - 30  days         94 - 494       94 - 494           1 -  2 months      149 - 539      149 - 539           3 -  6 months      131 - 452      131 - 452           7 - 11 months      117 - 401      117 - 401   12 months -  6 years       158 - 369      158 - 369           7 - 12 years       150 - 409      150 - 409               13 years       156 - 435       78 - 227               14 years        114 - 375       64 - 161               15 years        88 - 279       56 - 134               16 years        74 - 207       51 - 121               17 years        63 - 161       47 - 113          18 - 20 years        51 - 125       42 - 106          21 - 50 years         47 - 123       41 - 116          51 - 80 years        49 - 135       51 - 125              >80 years        48 - 129       48 - 129    AST 23 0 - 40 IU/L   ALT 16 0 - 32 IU/L  POCT HgB A1C     Status: None   Collection Time: 05/03/24  2:26 PM  Result Value Ref Range   Hemoglobin A1C 5.0 4.0 - 5.6 %   HbA1c POC (<> result, manual entry)     HbA1c, POC (prediabetic range)     HbA1c, POC (controlled diabetic range)          Assessment/Plan: 1. Encounter for general adult medical examination with abnormal findings (Primary) CPE performed, labs reviewed, declines colon screening. Pap done today  2. Mood swings May continue wellbutrin  as before - buPROPion  (WELLBUTRIN  XL) 300 MG 24 hr tablet; Take 1 tablet (  300 mg total) by mouth daily.  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  Dispense: 30 tablet; Refill: 2  4. Mixed hyperlipidemia Will work on diet and exercise  5. Impaired fasting blood sugar - POCT HgB A1C is 5.0 which is normal  6. Routine cervical smear - IGP, Aptima HPV  7. Visit for screening mammogram - MM 3D SCREENING MAMMOGRAM BILATERAL BREAST W/IMPLANT; Future  8. Colon cancer screening declined Declines any screening at this time   General Counseling: Demecia verbalizes understanding of the findings of todays visit and agrees with plan of treatment. I have discussed any further diagnostic evaluation that may be needed or ordered today. We also reviewed her medications today. she has been encouraged to call the office with any  questions or concerns that should arise related to todays visit.    Counseling:    Orders Placed This Encounter  Procedures   MM 3D SCREENING MAMMOGRAM BILATERAL BREAST W/IMPLANT   POCT HgB A1C    Meds ordered this encounter  Medications   mirtazapine  (REMERON ) 15 MG tablet    Sig: Take 1 tablet (15 mg total) by mouth at bedtime.    Dispense:  30 tablet    Refill:  2   buPROPion  (WELLBUTRIN  XL) 300 MG 24 hr tablet    Sig: Take 1 tablet (300 mg total) by mouth daily.    Dispense:  90 tablet    Refill:  1    This patient was seen by Tinnie Pro, PA-C in collaboration with Dr. Sigrid Bathe as a part of collaborative care agreement.  Total time spent:35 Minutes  Time spent includes review of chart, medications, test results, and follow up plan with the patient.     Sigrid CHRISTELLA Bathe, MD  Internal Medicine

## 2024-05-06 LAB — IGP, APTIMA HPV: HPV Aptima: NEGATIVE

## 2024-05-13 ENCOUNTER — Ambulatory Visit: Payer: Self-pay | Admitting: Physician Assistant

## 2024-05-14 NOTE — Telephone Encounter (Signed)
 Lvm for patient regarding normal pap.

## 2024-05-14 NOTE — Telephone Encounter (Signed)
-----   Message from Tinnie MARLA Pro sent at 05/13/2024  4:39 PM EDT ----- Please let her know that her pap was normal and HPV negative. Repeat in 5 years ----- Message ----- From: Almer Bi, CMA Sent: 05/03/2024   2:27 PM EDT To: Tinnie MARLA Pro, PA-C

## 2024-06-09 ENCOUNTER — Other Ambulatory Visit: Payer: Self-pay | Admitting: Physician Assistant

## 2024-06-09 ENCOUNTER — Encounter: Payer: Self-pay | Admitting: Physician Assistant

## 2024-06-09 DIAGNOSIS — F419 Anxiety disorder, unspecified: Secondary | ICD-10-CM

## 2024-06-09 MED ORDER — ALPRAZOLAM 0.25 MG PO TABS: ORAL_TABLET | ORAL | 0 refills | Status: AC

## 2024-06-16 ENCOUNTER — Encounter: Payer: Self-pay | Admitting: Physician Assistant

## 2024-06-21 ENCOUNTER — Other Ambulatory Visit: Payer: Self-pay | Admitting: Physician Assistant

## 2024-06-21 DIAGNOSIS — N390 Urinary tract infection, site not specified: Secondary | ICD-10-CM

## 2024-06-21 DIAGNOSIS — N200 Calculus of kidney: Secondary | ICD-10-CM

## 2024-06-23 ENCOUNTER — Telehealth: Payer: Self-pay | Admitting: Physician Assistant

## 2024-06-23 NOTE — Telephone Encounter (Signed)
 Urogynecology referral faxed to [facility] in [place]; [phone]. Notified patient. Gave pt telephone [phone]

## 2024-06-25 ENCOUNTER — Encounter: Payer: Self-pay | Admitting: Physician Assistant

## 2024-06-28 ENCOUNTER — Other Ambulatory Visit: Payer: Self-pay | Admitting: Physician Assistant

## 2024-06-28 DIAGNOSIS — N390 Urinary tract infection, site not specified: Secondary | ICD-10-CM

## 2024-06-28 MED ORDER — NITROFURANTOIN MONOHYD MACRO 100 MG PO CAPS: ORAL_CAPSULE | ORAL | 0 refills | Status: AC

## 2024-08-25 ENCOUNTER — Ambulatory Visit: Payer: Self-pay

## 2024-11-01 ENCOUNTER — Ambulatory Visit: Admitting: Physician Assistant

## 2025-05-05 ENCOUNTER — Other Ambulatory Visit: Admitting: Physician Assistant
